# Patient Record
Sex: Female | Born: 1985 | Race: Black or African American | Hispanic: No | Marital: Single | Smoking: Former smoker
Health system: Southern US, Community
[De-identification: ages and names within clinical notes are randomized; demographics above are authoritative.]

## PROBLEM LIST (undated history)

## (undated) ENCOUNTER — Inpatient Hospital Stay (HOSPITAL_COMMUNITY): Payer: Self-pay

## (undated) DIAGNOSIS — D649 Anemia, unspecified: Secondary | ICD-10-CM

## (undated) HISTORY — PX: NO PAST SURGERIES: SHX2092

---
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
## 2004-02-09 ENCOUNTER — Ambulatory Visit (HOSPITAL_COMMUNITY): Admission: RE | Admit: 2004-02-09 | Discharge: 2004-02-09 | Payer: Self-pay | Admitting: *Deleted

## 2004-02-13 ENCOUNTER — Emergency Department (HOSPITAL_COMMUNITY): Admission: EM | Admit: 2004-02-13 | Discharge: 2004-02-13 | Payer: Self-pay | Admitting: Emergency Medicine

## 2004-03-13 ENCOUNTER — Inpatient Hospital Stay (HOSPITAL_COMMUNITY): Admission: AD | Admit: 2004-03-13 | Discharge: 2004-03-13 | Payer: Self-pay | Admitting: *Deleted

## 2004-04-12 ENCOUNTER — Ambulatory Visit (HOSPITAL_COMMUNITY): Admission: RE | Admit: 2004-04-12 | Discharge: 2004-04-12 | Payer: Self-pay | Admitting: *Deleted

## 2004-06-02 ENCOUNTER — Ambulatory Visit: Payer: Self-pay | Admitting: Family Medicine

## 2004-06-02 ENCOUNTER — Inpatient Hospital Stay (HOSPITAL_COMMUNITY): Admission: AD | Admit: 2004-06-02 | Discharge: 2004-06-02 | Payer: Self-pay | Admitting: *Deleted

## 2004-06-11 ENCOUNTER — Ambulatory Visit (HOSPITAL_COMMUNITY): Admission: RE | Admit: 2004-06-11 | Discharge: 2004-06-11 | Payer: Self-pay | Admitting: *Deleted

## 2004-07-17 ENCOUNTER — Inpatient Hospital Stay (HOSPITAL_COMMUNITY): Admission: AD | Admit: 2004-07-17 | Discharge: 2004-07-17 | Payer: Self-pay | Admitting: *Deleted

## 2004-07-22 ENCOUNTER — Observation Stay (HOSPITAL_COMMUNITY): Admission: AD | Admit: 2004-07-22 | Discharge: 2004-07-23 | Payer: Self-pay | Admitting: *Deleted

## 2004-07-22 ENCOUNTER — Ambulatory Visit: Payer: Self-pay | Admitting: Obstetrics & Gynecology

## 2004-08-08 ENCOUNTER — Inpatient Hospital Stay (HOSPITAL_COMMUNITY): Admission: AD | Admit: 2004-08-08 | Discharge: 2004-08-11 | Payer: Self-pay | Admitting: Obstetrics and Gynecology

## 2004-08-08 ENCOUNTER — Ambulatory Visit: Payer: Self-pay | Admitting: Obstetrics & Gynecology

## 2004-12-12 ENCOUNTER — Ambulatory Visit: Payer: Self-pay | Admitting: Family Medicine

## 2005-06-24 ENCOUNTER — Inpatient Hospital Stay (HOSPITAL_COMMUNITY): Admission: AD | Admit: 2005-06-24 | Discharge: 2005-06-24 | Payer: Self-pay | Admitting: *Deleted

## 2005-07-23 ENCOUNTER — Ambulatory Visit (HOSPITAL_COMMUNITY): Admission: RE | Admit: 2005-07-23 | Discharge: 2005-07-23 | Payer: Self-pay | Admitting: *Deleted

## 2005-08-27 ENCOUNTER — Ambulatory Visit (HOSPITAL_COMMUNITY): Admission: RE | Admit: 2005-08-27 | Discharge: 2005-08-27 | Payer: Self-pay | Admitting: *Deleted

## 2005-10-12 ENCOUNTER — Inpatient Hospital Stay (HOSPITAL_COMMUNITY): Admission: AD | Admit: 2005-10-12 | Discharge: 2005-10-12 | Payer: Self-pay | Admitting: *Deleted

## 2005-10-12 ENCOUNTER — Ambulatory Visit: Payer: Self-pay | Admitting: Family Medicine

## 2005-11-12 ENCOUNTER — Inpatient Hospital Stay (HOSPITAL_COMMUNITY): Admission: AD | Admit: 2005-11-12 | Discharge: 2005-11-13 | Payer: Self-pay | Admitting: Obstetrics and Gynecology

## 2005-11-12 ENCOUNTER — Ambulatory Visit: Payer: Self-pay | Admitting: Family Medicine

## 2005-12-02 ENCOUNTER — Inpatient Hospital Stay (HOSPITAL_COMMUNITY): Admission: AD | Admit: 2005-12-02 | Discharge: 2005-12-02 | Payer: Self-pay | Admitting: *Deleted

## 2005-12-02 ENCOUNTER — Ambulatory Visit: Payer: Self-pay | Admitting: Obstetrics and Gynecology

## 2005-12-04 ENCOUNTER — Inpatient Hospital Stay (HOSPITAL_COMMUNITY): Admission: AD | Admit: 2005-12-04 | Discharge: 2005-12-07 | Payer: Self-pay | Admitting: Gynecology

## 2006-02-04 IMAGING — US US OB LIMITED
1 series · 18 of 28 positions shown · non-contrast
Comparison: none

CLINICAL DATA: Non-reactive non-stress test.

[Series 1: us fetal bpp w/o nonstress · non-contrast · 18 of 33 slices shown]
[im 1/33]
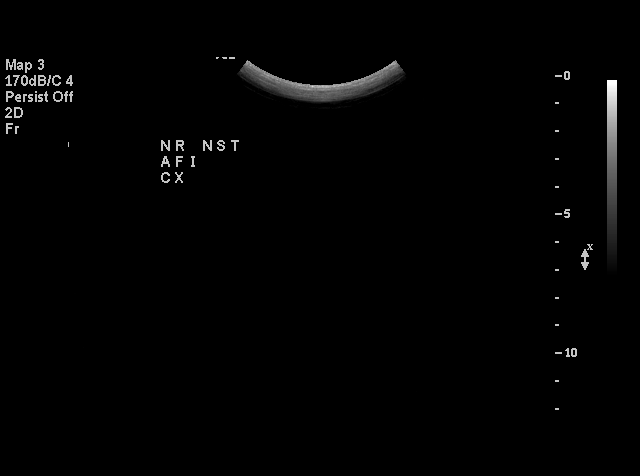
[im 3/33]
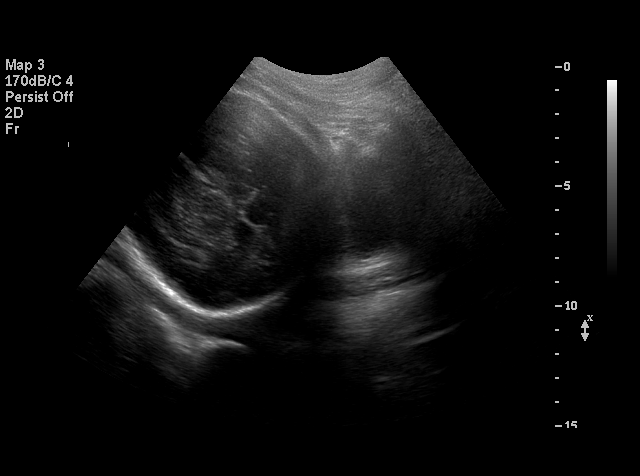
[im 4/33]
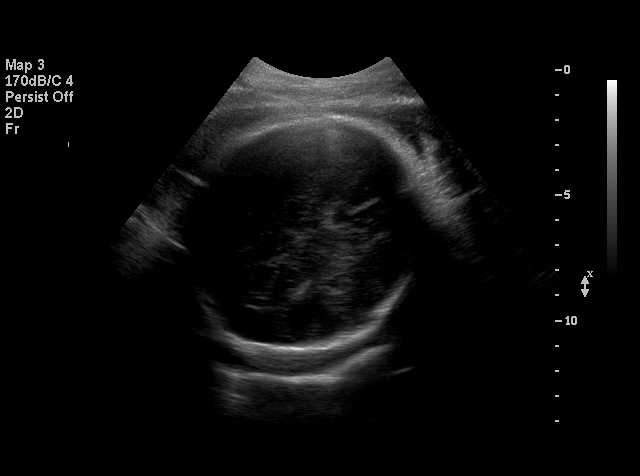
[im 6/33]
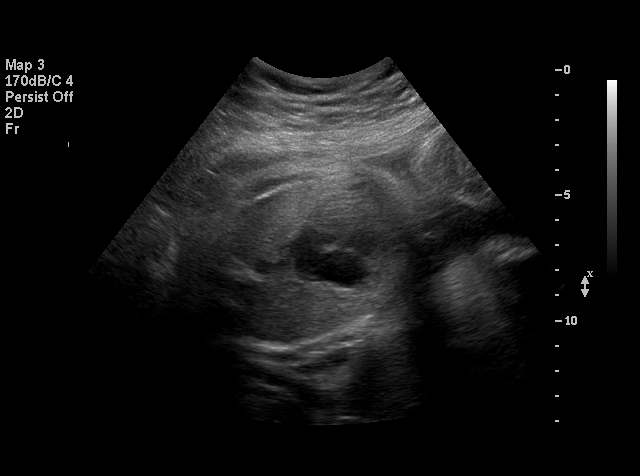
[im 9/33]
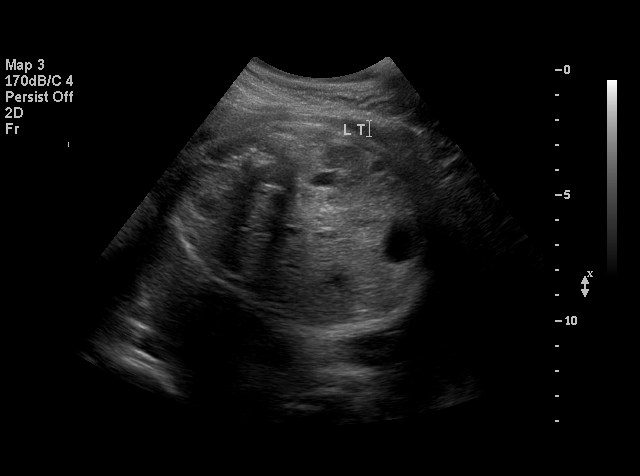
[im 10/33]
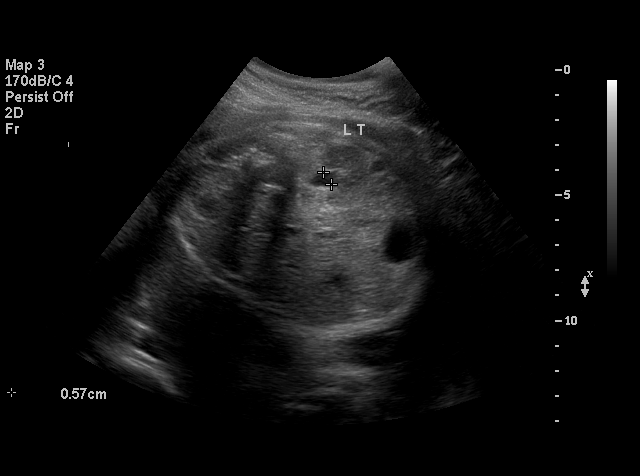
[im 12/33]
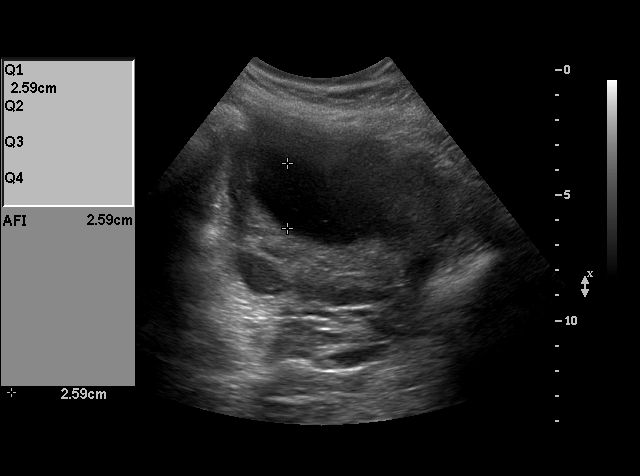
[im 14/33]
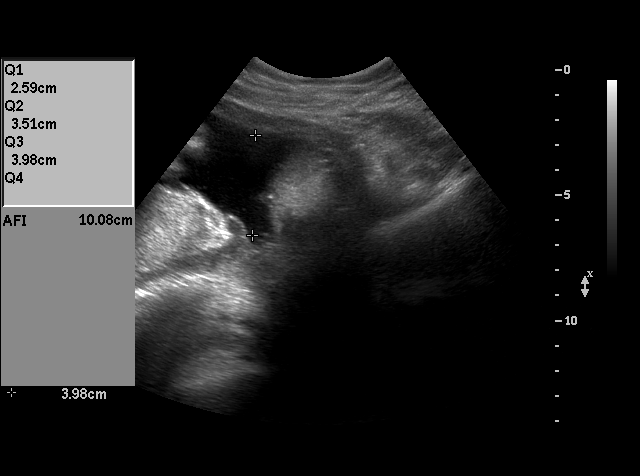
[im 16/33]
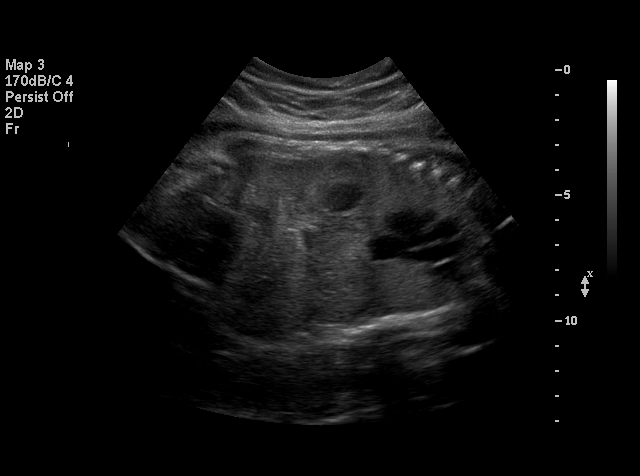
[im 17/33]
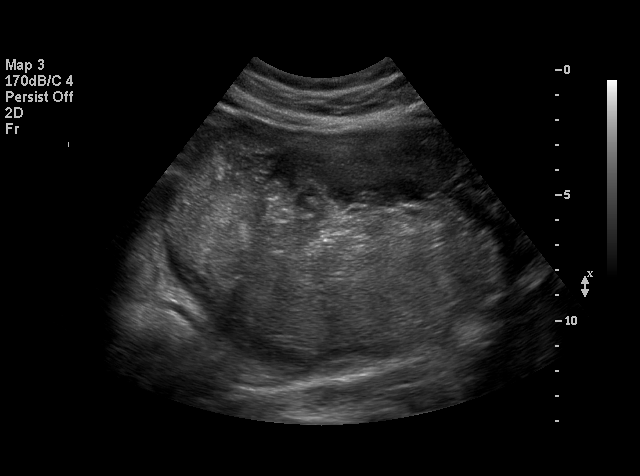
[im 19/33]
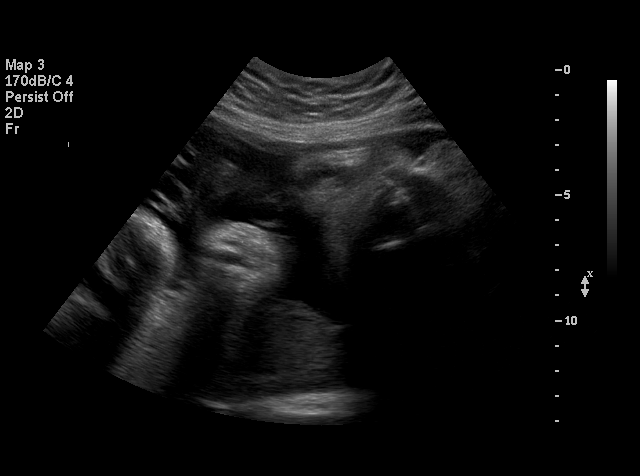
[im 21/33]
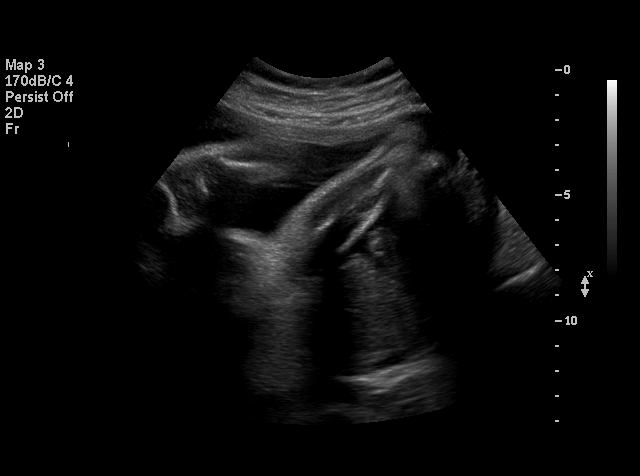
[im 23/33]
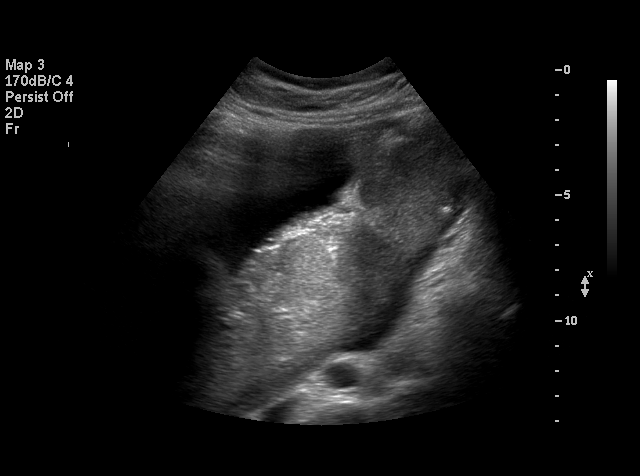
[im 25/33]
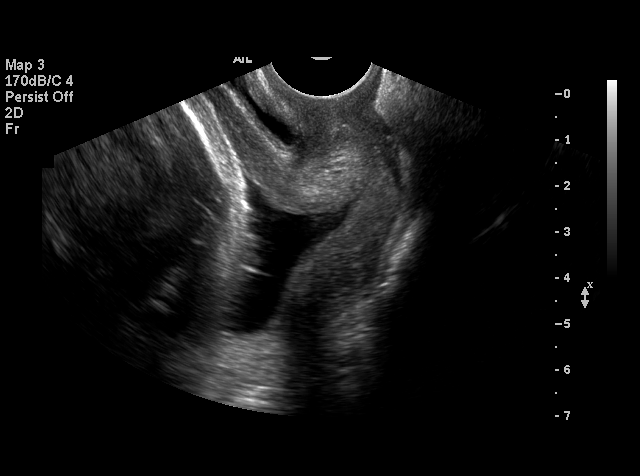
[im 27/33]
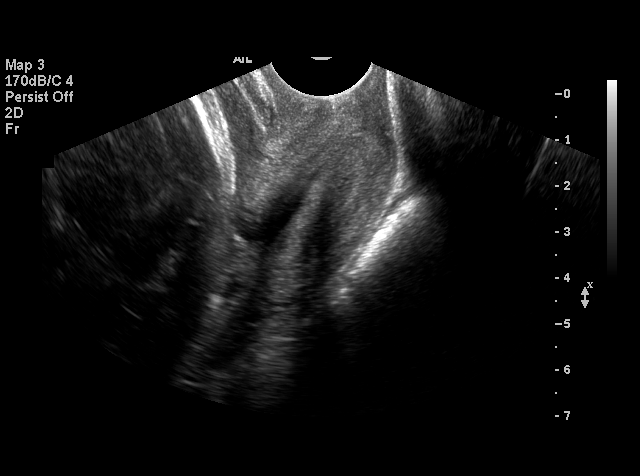
[im 29/33]
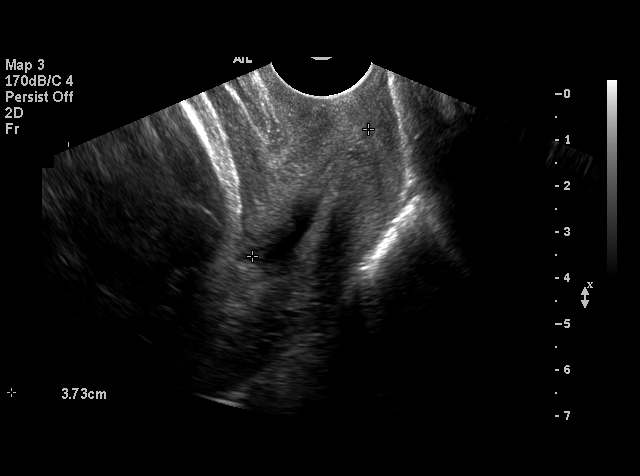
[im 30/33]
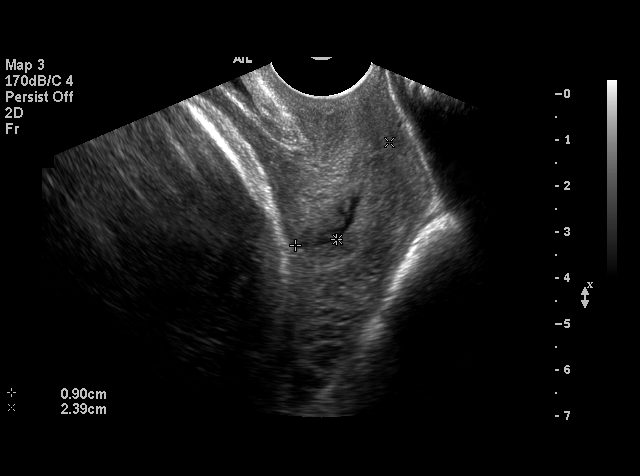
[im 33/33]
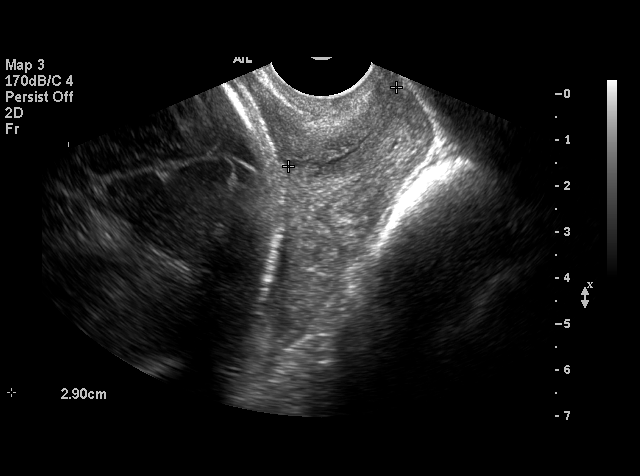

[18 of 28 positions shown; findings below may reference images not displayed]

LIMITED OBSTETRICAL ULTRASOUND WITH TRANSVAGINAL:
 Number of Fetuses: 1
 Heart Rate:  140
 Movement:  Yes
 Breathing:  Yes
 Presentation:  Cephalic
 Placental Location:  Fundal, posterior
 Grade:  II
 Previa:  No
 Amniotic Fluid (Subjective):  Normal
 Amniotic Fluid (Objective):  12.9 cm AFI (5th -95th%ile = 7.9 ? 24.9 cm for 35 wks)

 Fetal measurements and complete anatomic evaluation were not requested.  The following fetal anatomy was visualized during this exam:  Lateral ventricles, four chamber heart, stomach, kidneys, and bladder.  

 MATERNAL FINDINGS
 Cervix:  1.1 to 3.3 cm Transvaginally

 BIOPHYSICAL PROFILE

 Movement:  2    Time:  10 minutes
 Breathing:  2
 Tone:  2
 Amniotic Fluid:  2

 Total Score:  8
IMPRESSION: Living 35 week fetus in a cephalic presentation with a fundal posterior grade II placenta and normal amniotic fluid volume.  
 Biophysical profile score [DATE] in 10 minutes. 
 Dynamic cervix measuring between 1.1 and 3.3 cm.

## 2006-06-24 ENCOUNTER — Inpatient Hospital Stay (HOSPITAL_COMMUNITY): Admission: AD | Admit: 2006-06-24 | Discharge: 2006-06-24 | Payer: Self-pay | Admitting: Obstetrics and Gynecology

## 2006-10-23 ENCOUNTER — Ambulatory Visit (HOSPITAL_COMMUNITY): Admission: RE | Admit: 2006-10-23 | Discharge: 2006-10-23 | Payer: Self-pay | Admitting: Obstetrics & Gynecology

## 2006-12-12 ENCOUNTER — Ambulatory Visit: Payer: Self-pay | Admitting: Family Medicine

## 2006-12-12 ENCOUNTER — Other Ambulatory Visit: Admission: RE | Admit: 2006-12-12 | Discharge: 2006-12-12 | Payer: Self-pay | Admitting: Family Medicine

## 2006-12-12 ENCOUNTER — Encounter (INDEPENDENT_AMBULATORY_CARE_PROVIDER_SITE_OTHER): Payer: Self-pay | Admitting: Specialist

## 2007-01-01 ENCOUNTER — Inpatient Hospital Stay (HOSPITAL_COMMUNITY): Admission: AD | Admit: 2007-01-01 | Discharge: 2007-01-01 | Payer: Self-pay | Admitting: Obstetrics & Gynecology

## 2007-01-01 ENCOUNTER — Ambulatory Visit: Payer: Self-pay | Admitting: Physician Assistant

## 2007-02-10 ENCOUNTER — Inpatient Hospital Stay (HOSPITAL_COMMUNITY): Admission: AD | Admit: 2007-02-10 | Discharge: 2007-02-10 | Payer: Self-pay | Admitting: Family Medicine

## 2007-02-10 ENCOUNTER — Ambulatory Visit: Payer: Self-pay | Admitting: *Deleted

## 2007-02-10 ENCOUNTER — Ambulatory Visit: Payer: Self-pay | Admitting: Obstetrics and Gynecology

## 2007-02-15 ENCOUNTER — Ambulatory Visit: Payer: Self-pay | Admitting: *Deleted

## 2007-02-15 ENCOUNTER — Inpatient Hospital Stay (HOSPITAL_COMMUNITY): Admission: AD | Admit: 2007-02-15 | Discharge: 2007-02-15 | Payer: Self-pay | Admitting: Obstetrics & Gynecology

## 2007-02-17 ENCOUNTER — Inpatient Hospital Stay (HOSPITAL_COMMUNITY): Admission: AD | Admit: 2007-02-17 | Discharge: 2007-02-17 | Payer: Self-pay | Admitting: Family Medicine

## 2007-02-20 ENCOUNTER — Inpatient Hospital Stay (HOSPITAL_COMMUNITY): Admission: AD | Admit: 2007-02-20 | Discharge: 2007-02-22 | Payer: Self-pay | Admitting: Obstetrics & Gynecology

## 2007-02-20 ENCOUNTER — Ambulatory Visit: Payer: Self-pay | Admitting: Obstetrics & Gynecology

## 2008-10-20 ENCOUNTER — Emergency Department (HOSPITAL_COMMUNITY): Admission: EM | Admit: 2008-10-20 | Discharge: 2008-10-20 | Payer: Self-pay | Admitting: Emergency Medicine

## 2008-12-28 ENCOUNTER — Emergency Department (HOSPITAL_COMMUNITY): Admission: EM | Admit: 2008-12-28 | Discharge: 2008-12-28 | Payer: Self-pay | Admitting: Family Medicine

## 2009-02-19 ENCOUNTER — Emergency Department (HOSPITAL_COMMUNITY): Admission: EM | Admit: 2009-02-19 | Discharge: 2009-02-19 | Payer: Self-pay | Admitting: Emergency Medicine

## 2009-03-22 ENCOUNTER — Inpatient Hospital Stay (HOSPITAL_COMMUNITY): Admission: AD | Admit: 2009-03-22 | Discharge: 2009-03-22 | Payer: Self-pay | Admitting: Obstetrics and Gynecology

## 2009-03-24 ENCOUNTER — Inpatient Hospital Stay (HOSPITAL_COMMUNITY): Admission: AD | Admit: 2009-03-24 | Discharge: 2009-03-24 | Payer: Self-pay | Admitting: Obstetrics & Gynecology

## 2009-06-15 ENCOUNTER — Ambulatory Visit (HOSPITAL_COMMUNITY): Admission: RE | Admit: 2009-06-15 | Discharge: 2009-06-15 | Payer: Self-pay | Admitting: Obstetrics

## 2009-09-09 ENCOUNTER — Ambulatory Visit (HOSPITAL_COMMUNITY): Admission: AD | Admit: 2009-09-09 | Discharge: 2009-09-09 | Payer: Self-pay | Admitting: Obstetrics

## 2009-09-15 ENCOUNTER — Ambulatory Visit (HOSPITAL_COMMUNITY): Admission: RE | Admit: 2009-09-15 | Discharge: 2009-09-15 | Payer: Self-pay | Admitting: Obstetrics & Gynecology

## 2009-11-06 ENCOUNTER — Inpatient Hospital Stay (HOSPITAL_COMMUNITY): Admission: AD | Admit: 2009-11-06 | Discharge: 2009-11-06 | Payer: Self-pay | Admitting: Obstetrics

## 2009-11-10 ENCOUNTER — Inpatient Hospital Stay (HOSPITAL_COMMUNITY): Admission: AD | Admit: 2009-11-10 | Discharge: 2009-11-10 | Payer: Self-pay | Admitting: Obstetrics & Gynecology

## 2009-11-12 ENCOUNTER — Inpatient Hospital Stay (HOSPITAL_COMMUNITY): Admission: AD | Admit: 2009-11-12 | Discharge: 2009-11-15 | Payer: Self-pay | Admitting: Obstetrics & Gynecology

## 2009-11-12 ENCOUNTER — Ambulatory Visit: Payer: Self-pay | Admitting: Obstetrics and Gynecology

## 2009-11-12 ENCOUNTER — Inpatient Hospital Stay (HOSPITAL_COMMUNITY): Admission: AD | Admit: 2009-11-12 | Discharge: 2009-11-12 | Payer: Self-pay | Admitting: Obstetrics & Gynecology

## 2009-11-12 ENCOUNTER — Encounter: Payer: Self-pay | Admitting: Obstetrics & Gynecology

## 2010-06-04 ENCOUNTER — Emergency Department (HOSPITAL_COMMUNITY): Admission: EM | Admit: 2010-06-04 | Discharge: 2010-06-04 | Payer: Self-pay | Admitting: Emergency Medicine

## 2010-08-02 ENCOUNTER — Ambulatory Visit: Payer: Self-pay | Admitting: Nurse Practitioner

## 2010-08-02 DIAGNOSIS — M79609 Pain in unspecified limb: Secondary | ICD-10-CM

## 2010-08-02 DIAGNOSIS — I10 Essential (primary) hypertension: Secondary | ICD-10-CM

## 2010-08-02 DIAGNOSIS — I8390 Asymptomatic varicose veins of unspecified lower extremity: Secondary | ICD-10-CM

## 2010-08-02 DIAGNOSIS — E669 Obesity, unspecified: Secondary | ICD-10-CM | POA: Insufficient documentation

## 2010-09-22 ENCOUNTER — Encounter: Payer: Self-pay | Admitting: *Deleted

## 2010-10-02 NOTE — Letter (Signed)
Summary: Work Excuse  [facility]  [address] [place], [place] [address]   Phone: [phone]  Fax: [phone]    Today's Date: August 02, 2010  Name of Patient: Rhonda Howard  The above named patient had a medical visit today   Please take this into consideration when reviewing the time away from work  [name] has varicose veins in her lower extremities.  Legs have most likely been swelling from extended time on her feet over the past couple of weeks.  She has been ordered to get TED/Compression stockings to use while standing. She should also avoid salt in her diet. Comfortable shoes while at work is also beneficial.   Sincerely yours,   Lehman Prom FNP [facility]

## 2010-10-02 NOTE — Letter (Signed)
Summary: PT INFORMATION SHEET  PT INFORMATION SHEET   Imported By: Arta Bruce 08/02/2010 16:29:19  _____________________________________________________________________  External Attachment:    Type:   Image     Comment:   External Document
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
## 2010-10-02 NOTE — Letter (Signed)
Summary: Handout Printed  Printed Handout:  - Varicose Veins 

## 2010-10-10 IMAGING — US US OB TRANSVAGINAL MODIFY
1 series · 14 of 28 positions shown · non-contrast
Comparison: none

OBSTETRICAL ULTRASOUND:
 This ultrasound exam was performed in the [HOSPITAL] Ultrasound Department.  The OB US report was generated in the AS system, and faxed to the ordering physician.  This report is also available in [REDACTED] PACS.

[Series 1: us ob comp less 14 wks · 0.20mm/px · 56 acquisitions, 14 frames shown]
[im 3/56]
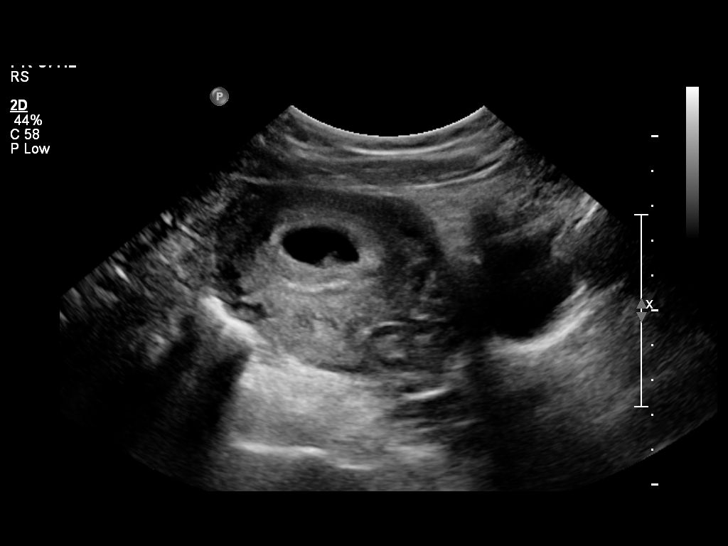
[im 7/56]
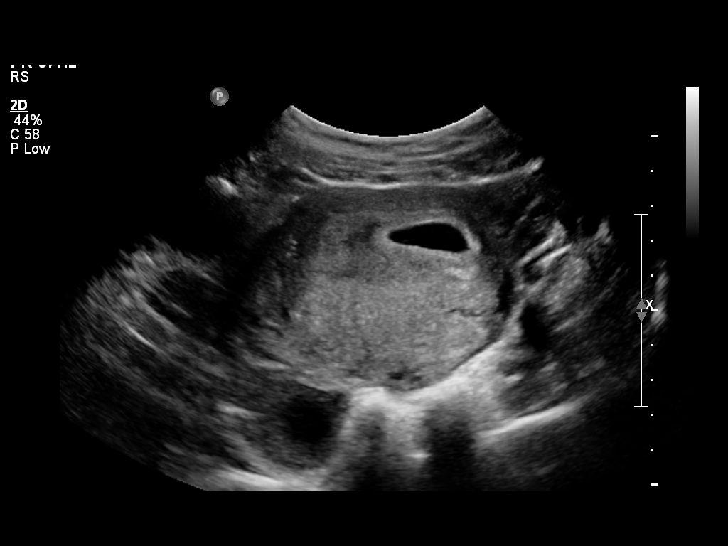
[im 11/56]
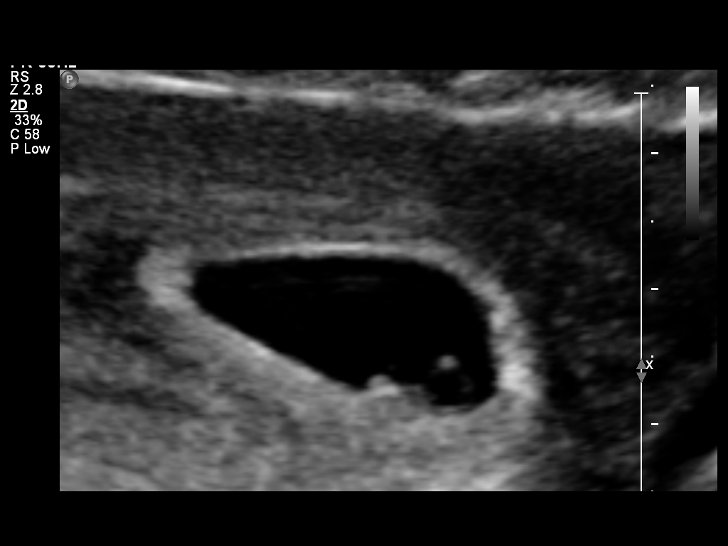
[im 15/56]
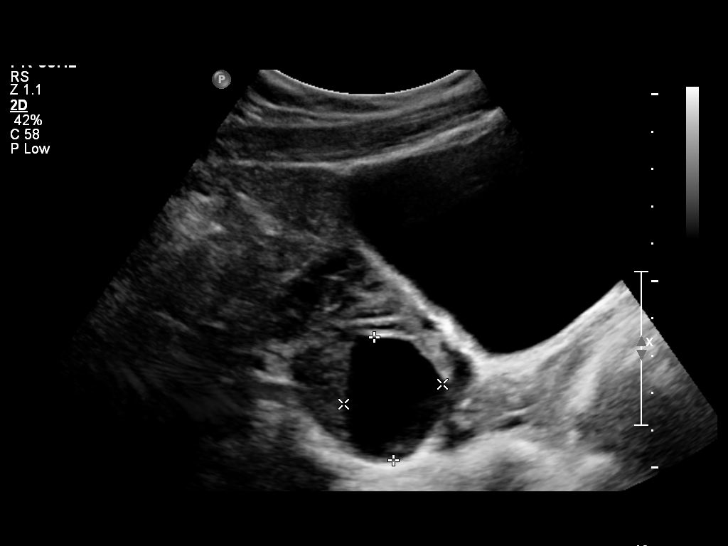
[im 19/56]
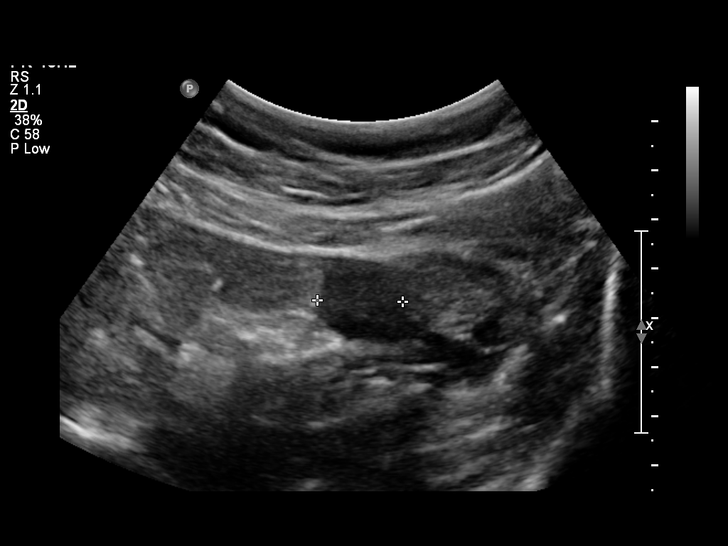
[im 23/56]
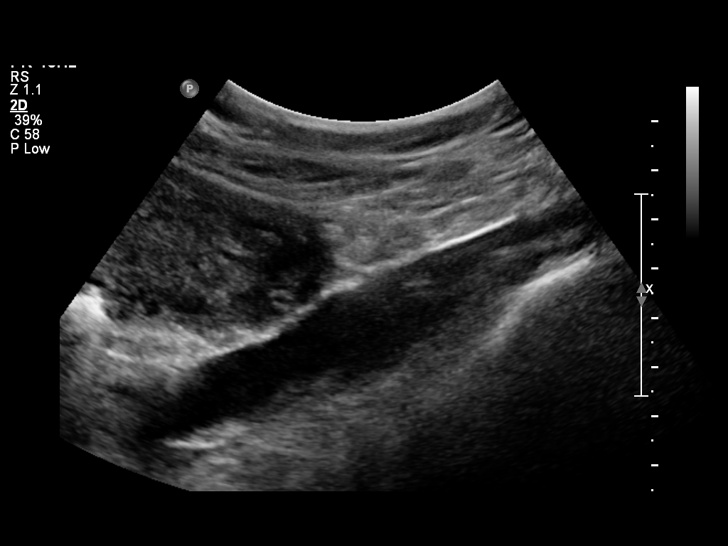
[im 27/56]
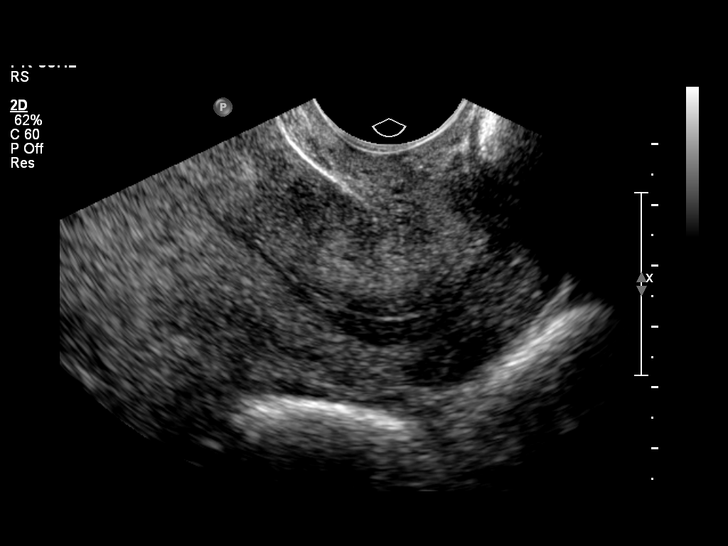
[im 31/56]
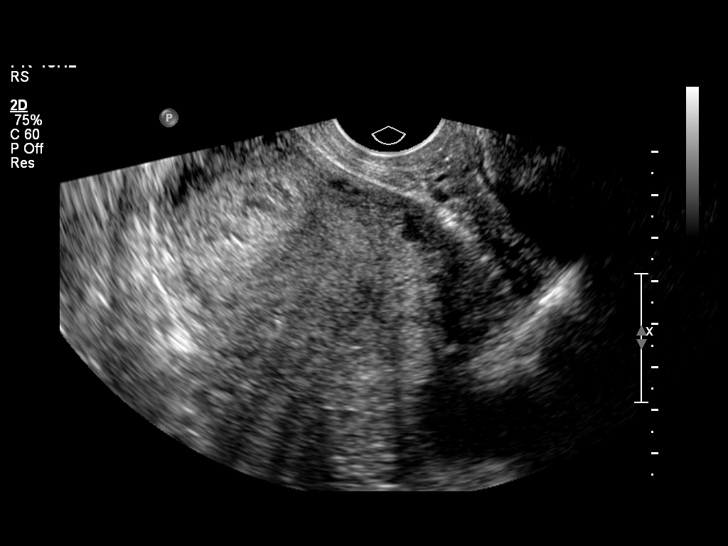
[im 35/56]
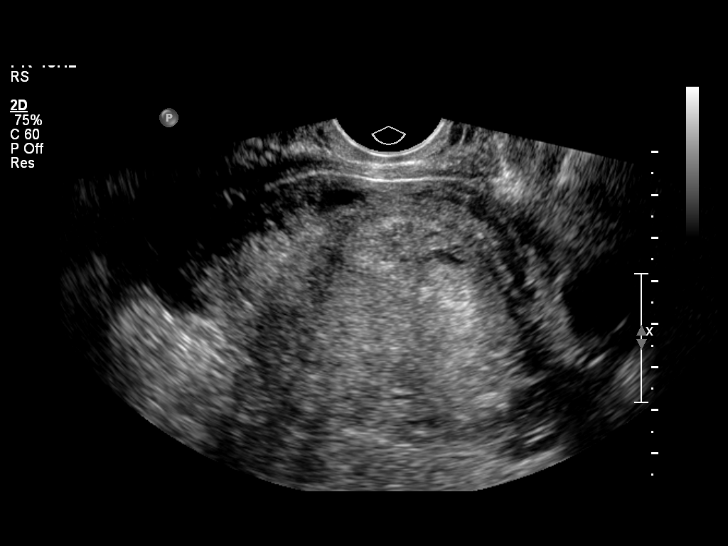
[im 39/56]
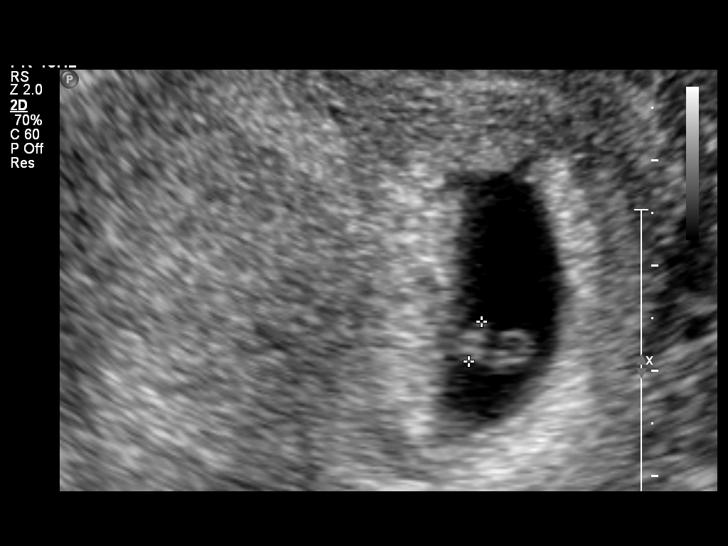
[im 43/56]
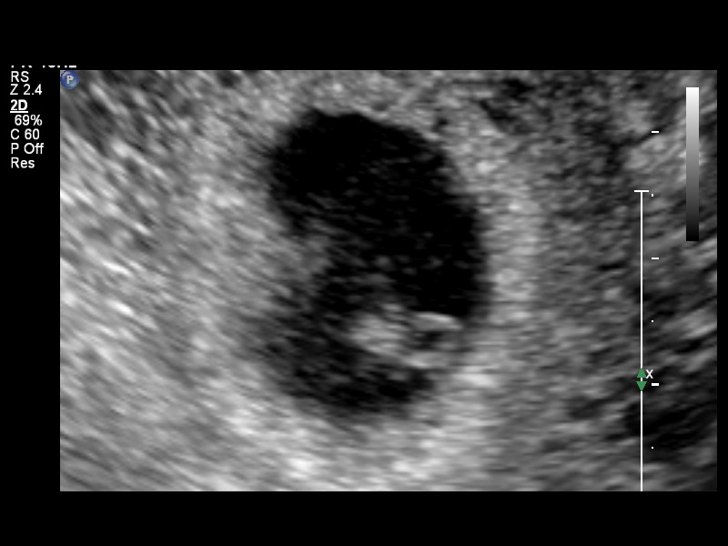
[im 47/56]
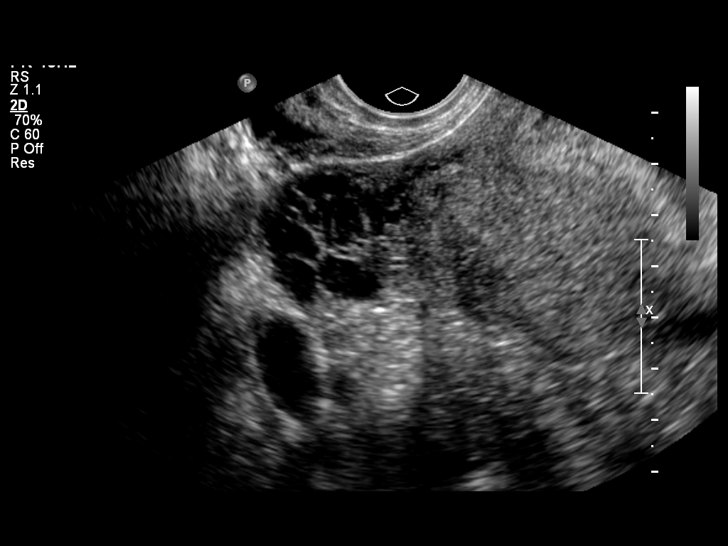
[im 51/56]
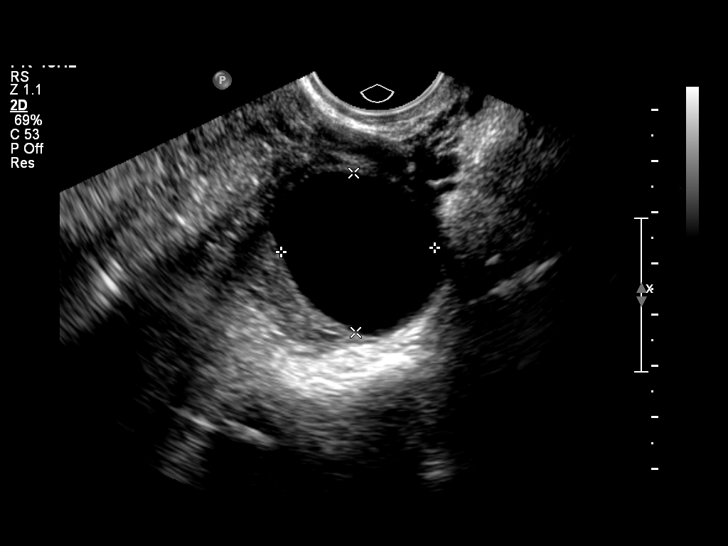
[im 56/56]
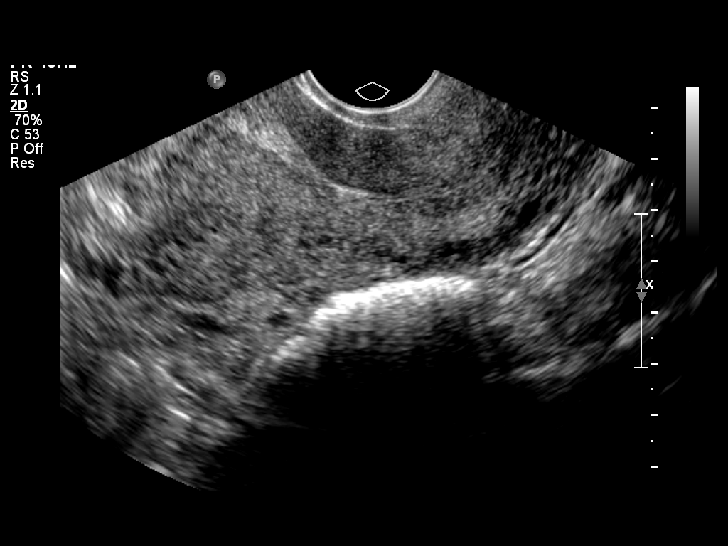

[14 of 28 positions shown; findings below may reference images not displayed]

IMPRESSION: See AS Obstetric US report.

## 2010-11-18 LAB — RH IMMUNE GLOBULIN WORKUP (NOT WOMEN'S HOSP)
ABO/RH(D): O NEG
Antibody Screen: NEGATIVE

## 2010-11-25 LAB — CBC
MCHC: 33.6 g/dL (ref 30.0–36.0)
MCHC: 33.7 g/dL (ref 30.0–36.0)
MCV: 98.1 fL (ref 78.0–100.0)
Platelets: 225 10*3/uL (ref 150–400)
Platelets: 226 10*3/uL (ref 150–400)
RDW: 12.7 % (ref 11.5–15.5)
RDW: 13.2 % (ref 11.5–15.5)

## 2010-11-25 LAB — RPR: RPR Ser Ql: NONREACTIVE

## 2010-11-25 LAB — RH IMMUNE GLOB WKUP(>/=20WKS)(NOT WOMEN'S HOSP)
Antibody Screen: POSITIVE
DAT, IgG: NEGATIVE
Fetal Screen: NEGATIVE

## 2010-12-09 LAB — URINALYSIS, ROUTINE W REFLEX MICROSCOPIC
Bilirubin Urine: NEGATIVE
Glucose, UA: NEGATIVE mg/dL
Glucose, UA: NEGATIVE mg/dL
Hgb urine dipstick: NEGATIVE
Hgb urine dipstick: NEGATIVE
Ketones, ur: NEGATIVE mg/dL
Ketones, ur: NEGATIVE mg/dL
Protein, ur: NEGATIVE mg/dL
Protein, ur: NEGATIVE mg/dL
Urobilinogen, UA: 1 mg/dL (ref 0.0–1.0)
pH: 6 (ref 5.0–8.0)

## 2010-12-09 LAB — WET PREP, GENITAL
Clue Cells Wet Prep HPF POC: NONE SEEN
Trich, Wet Prep: NONE SEEN
Yeast Wet Prep HPF POC: NONE SEEN

## 2010-12-09 LAB — GC/CHLAMYDIA PROBE AMP, GENITAL
Chlamydia, DNA Probe: NEGATIVE
GC Probe Amp, Genital: NEGATIVE

## 2010-12-09 LAB — POCT PREGNANCY, URINE
Preg Test, Ur: POSITIVE
Preg Test, Ur: POSITIVE

## 2010-12-09 LAB — CBC
HCT: 33.8 % — ABNORMAL LOW (ref 36.0–46.0)
Hemoglobin: 12 g/dL (ref 12.0–15.0)
RBC: 3.6 MIL/uL — ABNORMAL LOW (ref 3.87–5.11)

## 2010-12-09 LAB — ABO/RH: ABO/RH(D): O NEG

## 2011-01-03 IMAGING — US US OB DETAIL+14 WK
2 series · 14 of 28 positions shown · non-contrast
Comparison: none

OBSTETRICAL ULTRASOUND:
 This ultrasound exam was performed in the [HOSPITAL] Ultrasound Department.  The OB US report was generated in the AS system, and faxed to the ordering physician.  This report is also available in [HOSPITAL]?s [employer] and in [REDACTED] PACS.

[Series 1: us ob detail +14 wk · 0.16mm/px · 13 of 88 slices shown (1 of 2)]
[im 4/88]
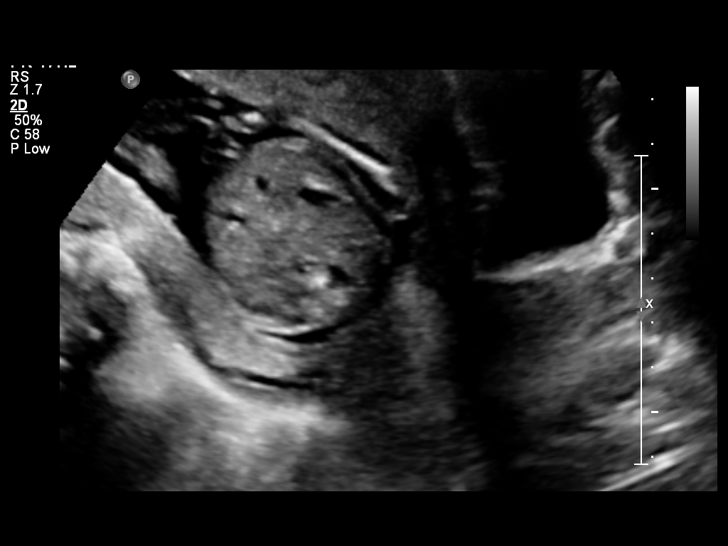
[im 11/88]
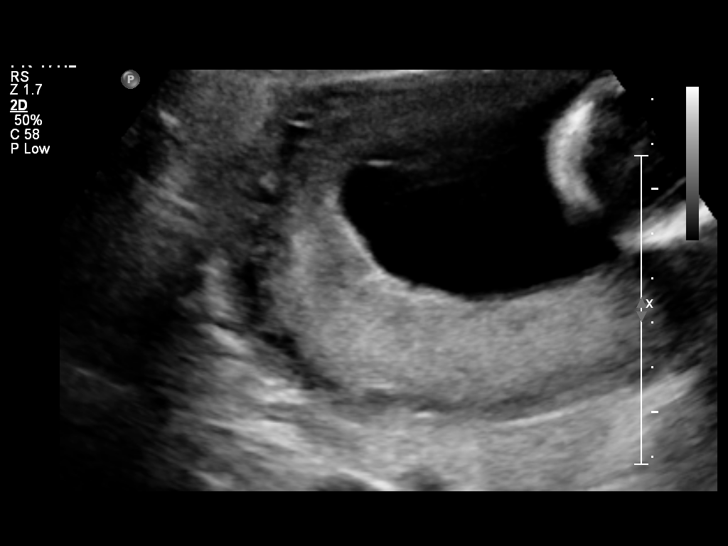
[im 17/88]
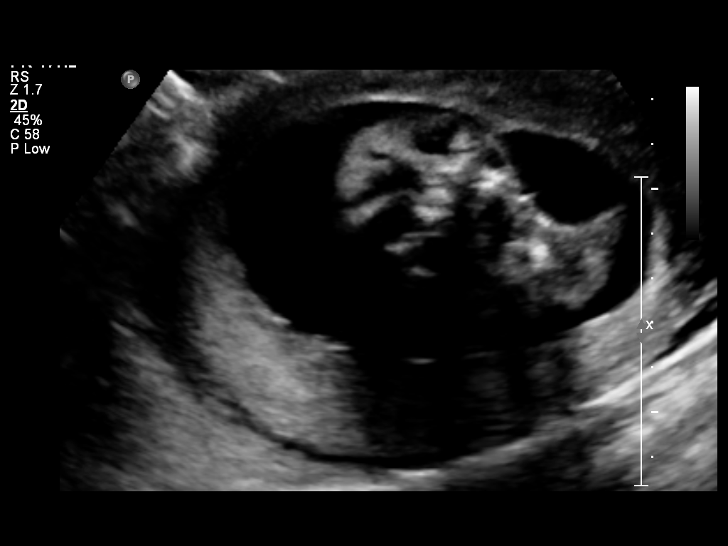
[im 24/88]
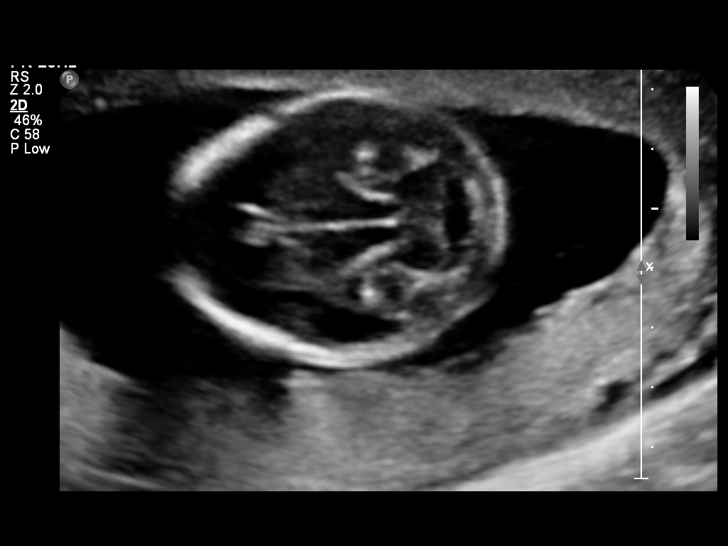
[im 31/88]
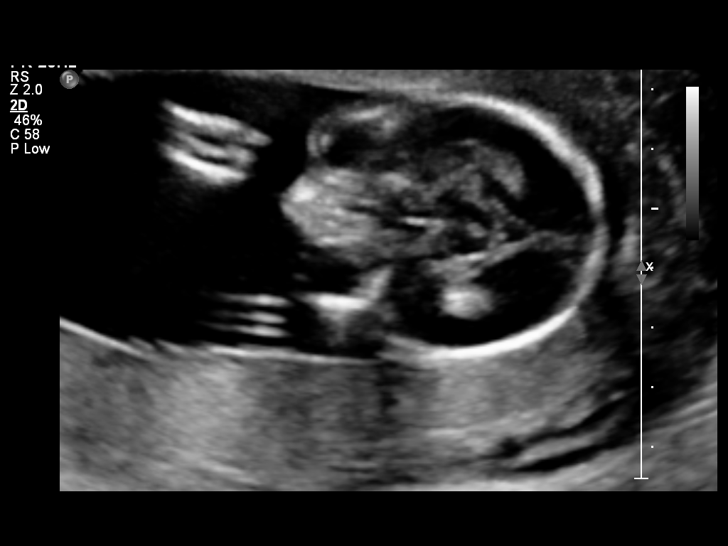
[im 37/88]
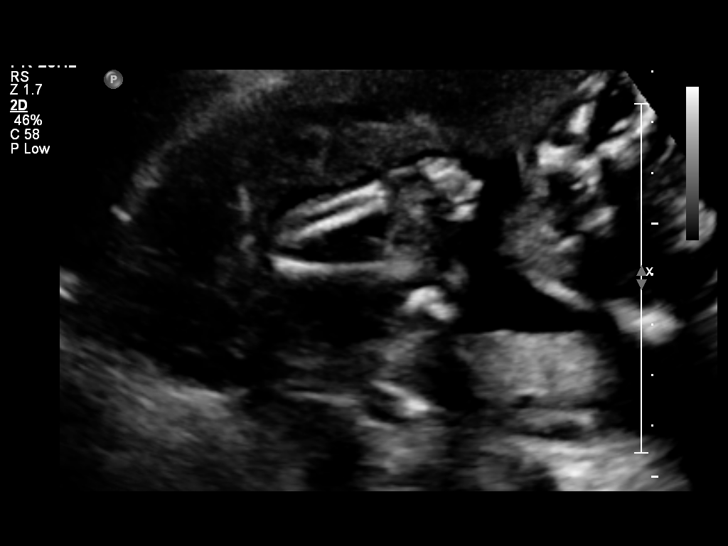
[im 44/88]
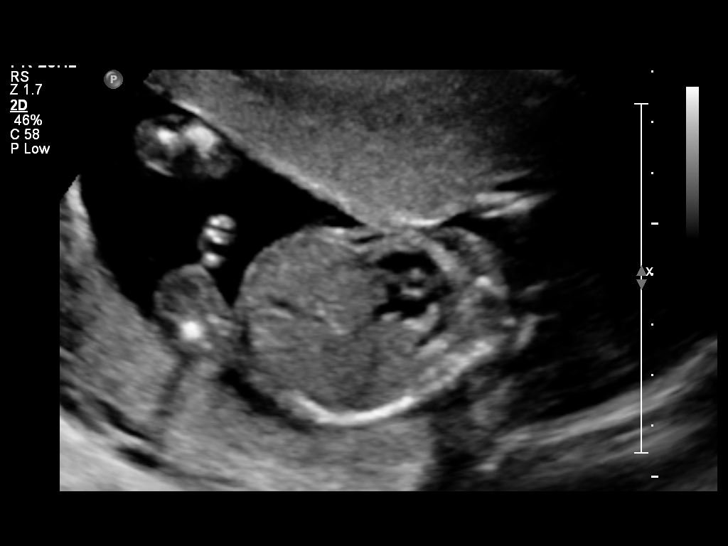
[im 51/88]
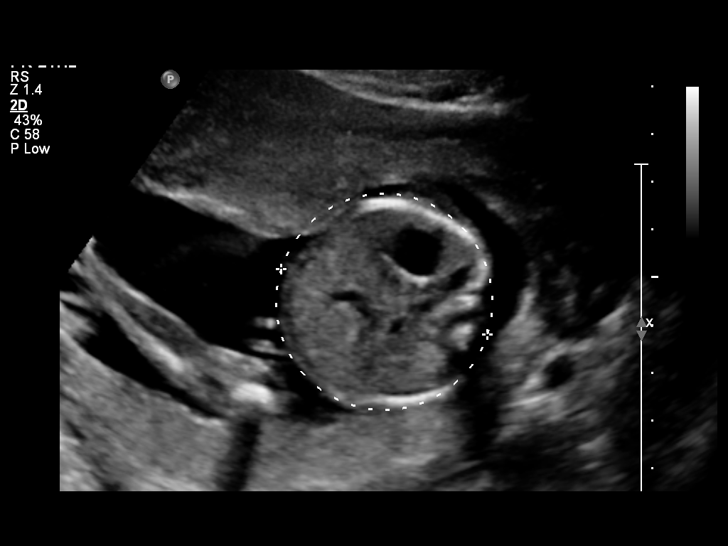
[im 57/88]
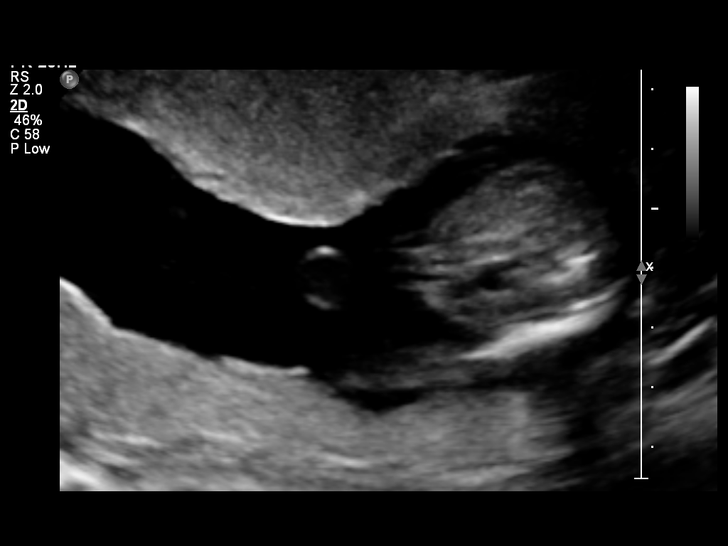
[im 64/88]
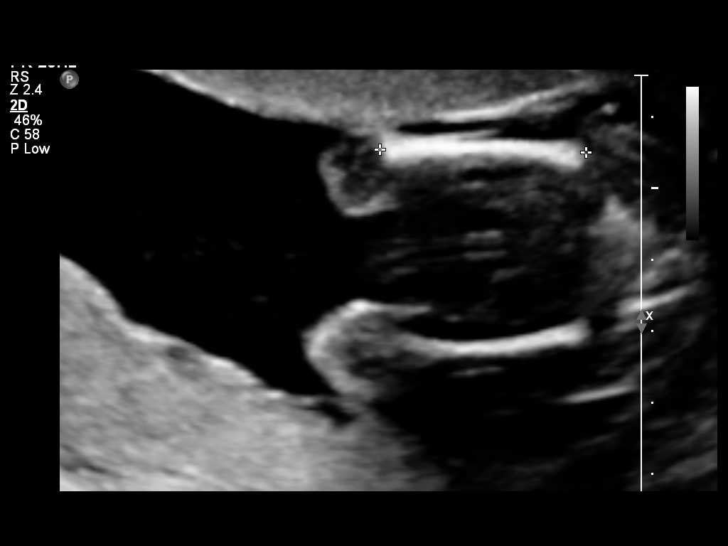
[im 71/88]
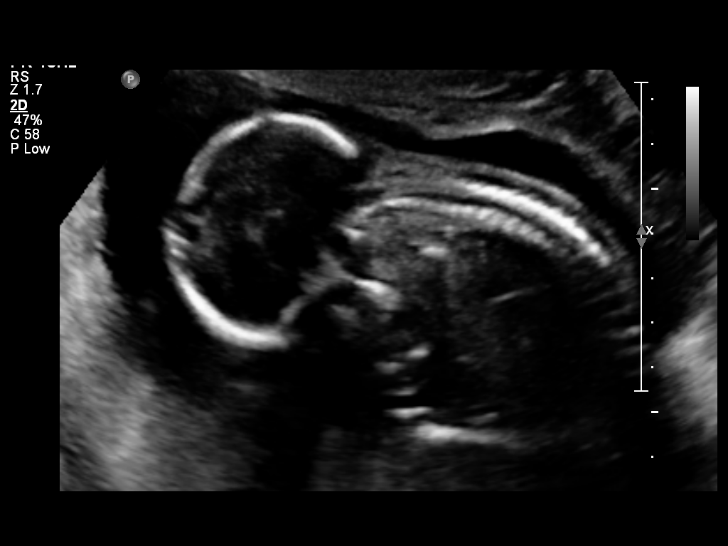
[im 77/88]
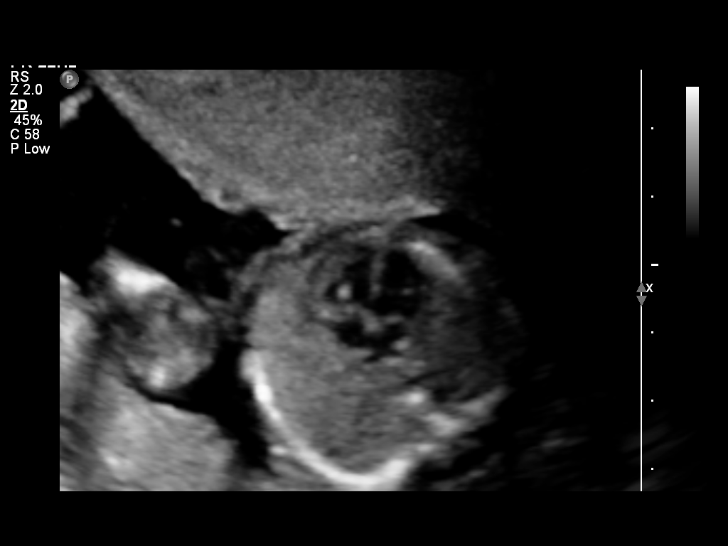
[im 84/88]
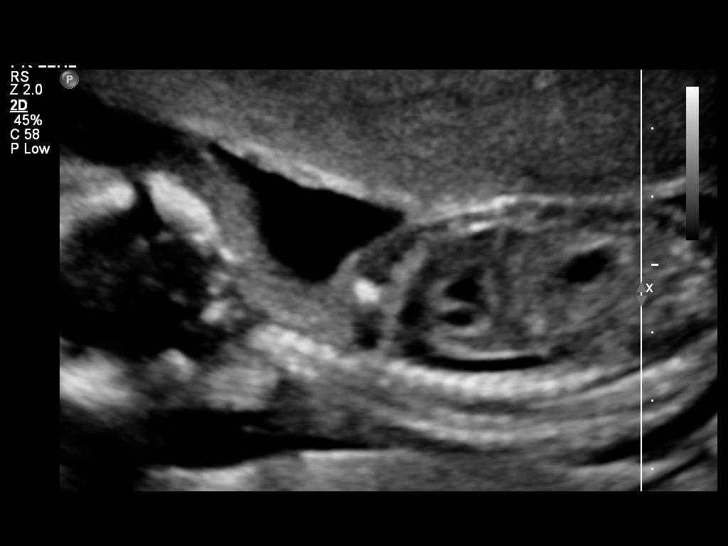

[Series 1: us ob detail +14 wk · 0.15mm/px · 1 of 4 slices shown (2 of 2)]
[im 1/4]
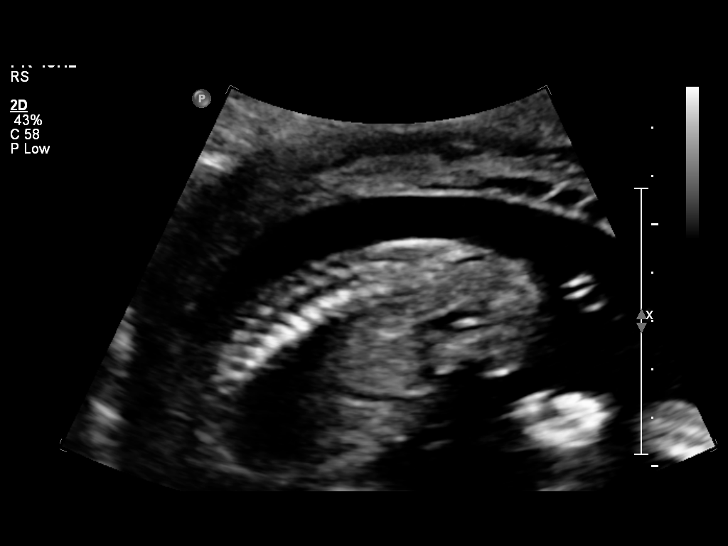

[14 of 28 positions shown; findings below may reference images not displayed]

IMPRESSION: See AS Obstetric US report.
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
## 2011-04-11 ENCOUNTER — Emergency Department (HOSPITAL_COMMUNITY)
Admission: EM | Admit: 2011-04-11 | Discharge: 2011-04-11 | Discharge status: Against Medical Advice | Payer: Medicaid Other | Attending: Emergency Medicine | Admitting: Emergency Medicine

## 2011-04-11 DIAGNOSIS — Z0389 Encounter for observation for other suspected diseases and conditions ruled out: Secondary | ICD-10-CM | POA: Insufficient documentation

## 2011-04-12 ENCOUNTER — Inpatient Hospital Stay (INDEPENDENT_AMBULATORY_CARE_PROVIDER_SITE_OTHER)
Admission: RE | Admit: 2011-04-12 | Discharge: 2011-04-12 | Disposition: A | Discharge status: Home / Self Care | Payer: Self-pay | Source: Ambulatory Visit | Attending: Family Medicine | Admitting: Family Medicine

## 2011-04-12 DIAGNOSIS — H109 Unspecified conjunctivitis: Secondary | ICD-10-CM

## 2011-06-19 LAB — RH IMMUNE GLOB WKUP(>/=20WKS)(NOT WOMEN'S HOSP): Fetal Screen: NEGATIVE

## 2011-06-19 LAB — CBC
Hemoglobin: 10.6 — ABNORMAL LOW
MCV: 93.8
Platelets: 249
RDW: 12.9
WBC: 4.9
WBC: 7.9

## 2011-06-19 LAB — RPR: RPR Ser Ql: NONREACTIVE

## 2011-06-19 LAB — WET PREP, GENITAL
Clue Cells Wet Prep HPF POC: NONE SEEN
Trich, Wet Prep: NONE SEEN
Yeast Wet Prep HPF POC: NONE SEEN

## 2011-06-20 LAB — WET PREP, GENITAL: Clue Cells Wet Prep HPF POC: NONE SEEN

## 2011-12-17 ENCOUNTER — Other Ambulatory Visit: Payer: Self-pay | Admitting: Family Medicine

## 2011-12-17 ENCOUNTER — Other Ambulatory Visit (HOSPITAL_COMMUNITY)
Admission: RE | Admit: 2011-12-17 | Discharge: 2011-12-17 | Disposition: A | Discharge status: Home / Self Care | Payer: Medicaid Other | Source: Ambulatory Visit | Attending: Internal Medicine | Admitting: Internal Medicine

## 2011-12-17 DIAGNOSIS — Z01419 Encounter for gynecological examination (general) (routine) without abnormal findings: Secondary | ICD-10-CM | POA: Insufficient documentation

## 2012-06-01 ENCOUNTER — Inpatient Hospital Stay (HOSPITAL_COMMUNITY): Payer: Medicaid Other

## 2012-06-01 ENCOUNTER — Inpatient Hospital Stay (HOSPITAL_COMMUNITY)
Admission: AD | Admit: 2012-06-01 | Discharge: 2012-06-01 | Disposition: A | Discharge status: Home / Self Care | Payer: Medicaid Other | Source: Ambulatory Visit | Attending: Obstetrics & Gynecology | Admitting: Obstetrics & Gynecology

## 2012-06-01 ENCOUNTER — Encounter (HOSPITAL_COMMUNITY): Payer: Self-pay | Admitting: *Deleted

## 2012-06-01 DIAGNOSIS — R109 Unspecified abdominal pain: Secondary | ICD-10-CM | POA: Insufficient documentation

## 2012-06-01 DIAGNOSIS — O9989 Other specified diseases and conditions complicating pregnancy, childbirth and the puerperium: Secondary | ICD-10-CM

## 2012-06-01 DIAGNOSIS — O26899 Other specified pregnancy related conditions, unspecified trimester: Secondary | ICD-10-CM

## 2012-06-01 DIAGNOSIS — Z3490 Encounter for supervision of normal pregnancy, unspecified, unspecified trimester: Secondary | ICD-10-CM

## 2012-06-01 DIAGNOSIS — O99891 Other specified diseases and conditions complicating pregnancy: Secondary | ICD-10-CM | POA: Insufficient documentation

## 2012-06-01 DIAGNOSIS — N949 Unspecified condition associated with female genital organs and menstrual cycle: Secondary | ICD-10-CM

## 2012-06-01 LAB — URINALYSIS, ROUTINE W REFLEX MICROSCOPIC
Hgb urine dipstick: NEGATIVE
Nitrite: NEGATIVE
Specific Gravity, Urine: 1.025 (ref 1.005–1.030)
Urobilinogen, UA: 1 mg/dL (ref 0.0–1.0)

## 2012-06-01 LAB — WET PREP, GENITAL
Trich, Wet Prep: NONE SEEN
Yeast Wet Prep HPF POC: NONE SEEN

## 2012-06-01 LAB — HCG, QUANTITATIVE, PREGNANCY: hCG, Beta Chain, Quant, S: 3672 m[IU]/mL — ABNORMAL HIGH (ref <5)

## 2012-06-01 NOTE — MAU Provider Note (Addendum)
Chief Complaint: Possible Pregnancy, Abdominal Pain and Vaginal Discharge   None    SUBJECTIVE HPI: Rhonda Howard is a 26 y.o. [id number] at [redacted]w[redacted]d by LMP who presents to maternity admissions reporting abdominal cramping and pain x1 week.  The pain is described as her entire lower abdomen but it is a little more on the left lower side.  She reports an increase in discharge but no vaginal itching/burning/odor, and she denies vaginal bleeding, urinary symptoms, h/a, dizziness, n/v, or fever/chills.     Past Medical History  Diagnosis Date  . No pertinent past medical history    Past Surgical History  Procedure Date  . No past surgeries    History   Social History  . Marital Status: Single    Spouse Name: N/A    Number of Children: N/A  . Years of Education: N/A   Occupational History  . Not on file.   Social History Main Topics  . Smoking status: Never Smoker   . Smokeless tobacco: Not on file  . Alcohol Use: No  . Drug Use: No  . Sexually Active: Yes   Other Topics Concern  . Not on file   Social History Narrative  . No narrative on file   No current facility-administered medications on file prior to encounter.   No current outpatient prescriptions on file prior to encounter.   No Known Allergies  ROS: Pertinent items in HPI  OBJECTIVE Blood pressure 110/70, pulse 82, temperature 99 F (37.2 C), temperature source Oral, resp. rate 16, height 5' 5.5" (1.664 m), weight 80.559 kg (177 lb 9.6 oz), last menstrual period 04/25/2012, SpO2 100.00%. GENERAL: Well-developed, well-nourished female in no acute distress.  HEENT: Normocephalic HEART: normal rate RESP: normal effort ABDOMEN: Soft, non-tender EXTREMITIES: Nontender, no edema NEURO: Alert and oriented Pelvic exam: Cervix pink, visually closed, without lesion, scant white creamy discharge, vaginal walls and external genitalia normal Bimanual exam: Cervix 0/long/high, firm, anterior, mild CMT, uterus  Mildly tender,  nonenlarged, adnexa without tenderness, enlargement, or mass  LAB RESULTS Results for orders placed during the hospital encounter of 06/01/12 (from the past 24 hour(s))  URINALYSIS, ROUTINE W REFLEX MICROSCOPIC     Status: Normal   Collection Time   06/01/12 12:02 PM      Component Value Range   Color, Urine YELLOW  YELLOW   APPearance CLEAR  CLEAR   Specific Gravity, Urine 1.025  1.005 - 1.030   pH 6.0  5.0 - 8.0   Glucose, UA NEGATIVE  NEGATIVE mg/dL   Hgb urine dipstick NEGATIVE  NEGATIVE   Bilirubin Urine NEGATIVE  NEGATIVE   Ketones, ur NEGATIVE  NEGATIVE mg/dL   Protein, ur NEGATIVE  NEGATIVE mg/dL   Urobilinogen, UA 1.0  0.0 - 1.0 mg/dL   Nitrite NEGATIVE  NEGATIVE   Leukocytes, UA NEGATIVE  NEGATIVE    PLAN Quant HCG, wet prep, GCC, OB U/S Care assumed by Thressa Sheller, CNM   Sharen Counter Certified Nurse-Midwife 06/01/2012  12:35 PM  A: Ultrasound reveals an intrauterine pregnancy that is AGA.  P: Discharge home 1st trimester danger signs given Will establish care with a primary OB.

## 2012-06-01 NOTE — MAU Provider Note (Signed)
Attestation of Attending Supervision of Advanced Practitioner (CNM/NP): Evaluation and management procedures were performed by the Advanced Practitioner under my supervision and collaboration.  I have reviewed the Advanced Practitioner's note and chart, and I agree with the management and plan.  Nazire Fruth, MD, FACOG Attending Obstetrician & Gynecologist Faculty Practice, [facility]  

## 2012-06-01 NOTE — MAU Note (Signed)
Patient states she had a positive home pregnancy test yesterday. Has been having abdominal cramping and a vaginal discharge with itching.

## 2012-06-01 NOTE — MAU Provider Note (Signed)
Attestation of Attending Supervision of Advanced Practitioner (CNM/NP): Evaluation and management procedures were performed by the Advanced Practitioner under my supervision and collaboration.  I have reviewed the Advanced Practitioner's note and chart, and I agree with the management and plan.  Natalyah Cummiskey, MD, FACOG Attending Obstetrician & Gynecologist Faculty Practice, [facility]   

## 2012-06-02 LAB — GC/CHLAMYDIA PROBE AMP, GENITAL
Chlamydia, DNA Probe: POSITIVE — AB
GC Probe Amp, Genital: NEGATIVE

## 2012-06-30 ENCOUNTER — Encounter (HOSPITAL_COMMUNITY): Payer: Self-pay

## 2012-06-30 ENCOUNTER — Emergency Department (HOSPITAL_COMMUNITY)
Admission: EM | Admit: 2012-06-30 | Discharge: 2012-07-01 | Disposition: A | Discharge status: Home / Self Care | Payer: Medicaid Other | Attending: Emergency Medicine | Admitting: Emergency Medicine

## 2012-06-30 DIAGNOSIS — O210 Mild hyperemesis gravidarum: Secondary | ICD-10-CM | POA: Insufficient documentation

## 2012-06-30 LAB — CBC WITH DIFFERENTIAL/PLATELET
Basophils Relative: 0 % (ref 0–1)
Eosinophils Absolute: 0.3 10*3/uL (ref 0.0–0.7)
MCH: 31.4 pg (ref 26.0–34.0)
MCHC: 35.5 g/dL (ref 30.0–36.0)
Neutrophils Relative %: 33 % — ABNORMAL LOW (ref 43–77)
Platelets: 224 10*3/uL (ref 150–400)
RDW: 11.4 % — ABNORMAL LOW (ref 11.5–15.5)

## 2012-06-30 LAB — COMPREHENSIVE METABOLIC PANEL
ALT: 7 U/L (ref 0–35)
Albumin: 3.6 g/dL (ref 3.5–5.2)
Alkaline Phosphatase: 50 U/L (ref 39–117)
Calcium: 9.3 mg/dL (ref 8.4–10.5)
Potassium: 3.5 mEq/L (ref 3.5–5.1)
Sodium: 134 mEq/L — ABNORMAL LOW (ref 135–145)
Total Protein: 7.2 g/dL (ref 6.0–8.3)

## 2012-06-30 LAB — URINE MICROSCOPIC-ADD ON

## 2012-06-30 LAB — URINALYSIS, ROUTINE W REFLEX MICROSCOPIC
Glucose, UA: NEGATIVE mg/dL
Ketones, ur: NEGATIVE mg/dL
Specific Gravity, Urine: 1.024 (ref 1.005–1.030)
pH: 7 (ref 5.0–8.0)

## 2012-06-30 MED ORDER — ONDANSETRON HCL 4 MG PO TABS: 4.0000 mg | ORAL_TABLET | Freq: Four times a day (QID) | ORAL | Status: DC

## 2012-06-30 MED ORDER — SODIUM CHLORIDE 0.9 % IV BOLUS (SEPSIS)
1000.0000 mL | Freq: Once | INTRAVENOUS | Status: AC
  Administered 2012-06-30: 1000 mL via INTRAVENOUS

## 2012-06-30 MED ORDER — ONDANSETRON HCL 4 MG/2ML IJ SOLN
4.0000 mg | Freq: Once | INTRAMUSCULAR | Status: AC
  Administered 2012-06-30: 4 mg via INTRAVENOUS
  Filled 2012-06-30 (×2): qty 2

## 2012-06-30 NOTE — ED Notes (Signed)
Pt reports she is [redacted] weeks pregnant, pt reports she vomits daily, unable to keep food down, feeling dizzy and dehydrated x9 weeks. Pt denies having an OBGYN. Pt reports lower abd "cramping," denies vaginal bleeding, d/c, or odor

## 2012-06-30 NOTE — ED Provider Notes (Signed)
History     CSN: [id number]  Arrival date & time 06/30/12  2003   First MD Initiated Contact with Patient 06/30/12 2217      Chief Complaint  Patient presents with  . Emesis During Pregnancy    (Consider location/radiation/quality/duration/timing/severity/associated sxs/prior treatment) HPI  generally healthy G63 P8 26 year old female who is [redacted] weeks pregnant presents complaining of persistent vomiting. patient reports since she found out that she was pregnant she has been having persistent nausea, and vomiting for the past several weeks. The symptoms worsen whenever she eats or drink anything. She reports vomiting up to 5 times per day. The onset was gradual, persistent, moderate in severity, and unrelieved with drinking ginger ale and crackers. She occasionally endorse abdominal pain from her vomiting. Currently denies any abdominal pain at this time. She denies fever, chills, chest pain, shortness of breath, abnormal vaginal bleeding, or vaginal discharge.  Past Medical History  Diagnosis Date  . No pertinent past medical history     Past Surgical History  Procedure Date  . No past surgeries     History reviewed. No pertinent family history.  History  Substance Use Topics  . Smoking status: Never Smoker   . Smokeless tobacco: Not on file  . Alcohol Use: No    OB History    Grav Para Term Preterm Abortions TAB SAB Ect Mult Living   5 4 4       4       Review of Systems  All other systems reviewed and are negative.    Allergies  Review of patient's allergies indicates no known allergies.  Home Medications  No current outpatient prescriptions on file.  BP 116/76  Pulse 99  Temp 98.6 F (37 C) (Oral)  Resp 16  SpO2 100%  LMP 04/25/2012  Physical Exam  Nursing note and vitals reviewed. Constitutional: She is oriented to person, place, and time. She appears well-developed and well-nourished. No distress.       Awake, alert, nontoxic appearance  HENT:    Head: Atraumatic.  Mouth/Throat: Oropharynx is clear and moist.  Eyes: Conjunctivae normal are normal. Right eye exhibits no discharge. Left eye exhibits no discharge.  Neck: Neck supple.  Cardiovascular: Normal rate and regular rhythm.   Pulmonary/Chest: Effort normal. No respiratory distress. She exhibits no tenderness.  Abdominal: Soft. Bowel sounds are normal. She exhibits no distension. There is no tenderness. There is no rebound and no guarding.  Musculoskeletal: Normal range of motion. She exhibits no tenderness.       ROM appears intact, no obvious focal weakness  Neurological: She is alert and oriented to person, place, and time.       Mental status and motor strength appears intact  Skin: No rash noted.  Psychiatric: She has a normal mood and affect.    ED Course  Procedures (including critical care time)  Labs Reviewed  CBC WITH DIFFERENTIAL - Abnormal; Notable for the following:    RBC 3.63 (*)     Hemoglobin 11.4 (*)     HCT 32.1 (*)     RDW 11.4 (*)     Neutrophils Relative 33 (*)     Lymphocytes Relative 54 (*)     All other components within normal limits  COMPREHENSIVE METABOLIC PANEL - Abnormal; Notable for the following:    Sodium 134 (*)     All other components within normal limits  URINALYSIS, ROUTINE W REFLEX MICROSCOPIC - Abnormal; Notable for the following:    Leukocytes,  UA SMALL (*)     All other components within normal limits  POCT PREGNANCY, URINE - Abnormal; Notable for the following:    Preg Test, Ur POSITIVE (*)     All other components within normal limits  URINE MICROSCOPIC-ADD ON - Abnormal; Notable for the following:    Squamous Epithelial / LPF FEW (*)     Bacteria, UA FEW (*)     All other components within normal limits  URINE CULTURE   No results found.   No diagnosis found.  1. Hyperemesis gravidarum   MDM  Hyperemesis gravidarum.  Oral mucosa moist.  No abd tenderness on exam.  Will check basic labs.  IVF and antiemetic  given.    10:34 PM Pt has normal electrolytes, UA shows no infection, preg test is positive, normal WBC.  Pt did vomit once in ED.     12:11 AM Report given to Dr. Norlene Campbell, who will dispo pt once able to tolerates PO.    BP 120/80  Pulse 81  Temp 97.4 F (36.3 C) (Oral)  Resp 16  SpO2 99%  LMP 04/25/2012  I have reviewed nursing notes and vital signs.  I reviewed available ER/hospitalization records thought the EMR       Fayrene Helper, [place] 07/01/12 0011

## 2012-07-01 NOTE — ED Provider Notes (Signed)
Medical screening examination/treatment/procedure(s) were performed by non-physician practitioner and as supervising physician I was immediately available for consultation/collaboration.   Loren Racer, MD 07/01/12 [phone]

## 2012-07-02 LAB — URINE CULTURE

## 2012-09-02 NOTE — L&D Delivery Note (Signed)
Delivery Note At 12:59 PM a viable female was delivered via  (Presentation: ;  ).  APGAR: , ; weight .   Placenta status: , .  Cord:  with the following complications: .  Cord pH: not done  Anesthesia:   Episiotomy:  Lacerations:  Suture Repair: 2.0 Est. Blood Loss (mL):   Mom to postpartum.  Baby to nursery-stable.  Shalom Ware A 01/30/2013, 1:11 PM

## 2012-09-30 ENCOUNTER — Inpatient Hospital Stay (HOSPITAL_COMMUNITY)
Admission: EM | Admit: 2012-09-30 | Discharge: 2012-09-30 | Disposition: A | Discharge status: Home / Self Care | Payer: Medicaid Other | Attending: Obstetrics and Gynecology | Admitting: Obstetrics and Gynecology

## 2012-09-30 ENCOUNTER — Encounter (HOSPITAL_COMMUNITY): Payer: Self-pay | Admitting: Family Medicine

## 2012-09-30 DIAGNOSIS — E869 Volume depletion, unspecified: Secondary | ICD-10-CM

## 2012-09-30 DIAGNOSIS — R109 Unspecified abdominal pain: Secondary | ICD-10-CM

## 2012-09-30 DIAGNOSIS — B379 Candidiasis, unspecified: Secondary | ICD-10-CM

## 2012-09-30 DIAGNOSIS — B373 Candidiasis of vulva and vagina: Secondary | ICD-10-CM | POA: Insufficient documentation

## 2012-09-30 DIAGNOSIS — B3731 Acute candidiasis of vulva and vagina: Secondary | ICD-10-CM | POA: Insufficient documentation

## 2012-09-30 DIAGNOSIS — K922 Gastrointestinal hemorrhage, unspecified: Secondary | ICD-10-CM

## 2012-09-30 DIAGNOSIS — O0930 Supervision of pregnancy with insufficient antenatal care, unspecified trimester: Secondary | ICD-10-CM | POA: Insufficient documentation

## 2012-09-30 DIAGNOSIS — O212 Late vomiting of pregnancy: Secondary | ICD-10-CM | POA: Insufficient documentation

## 2012-09-30 DIAGNOSIS — O99891 Other specified diseases and conditions complicating pregnancy: Secondary | ICD-10-CM | POA: Insufficient documentation

## 2012-09-30 DIAGNOSIS — O2390 Unspecified genitourinary tract infection in pregnancy, unspecified trimester: Secondary | ICD-10-CM | POA: Insufficient documentation

## 2012-09-30 DIAGNOSIS — N760 Acute vaginitis: Secondary | ICD-10-CM | POA: Insufficient documentation

## 2012-09-30 DIAGNOSIS — R197 Diarrhea, unspecified: Secondary | ICD-10-CM | POA: Insufficient documentation

## 2012-09-30 DIAGNOSIS — B9789 Other viral agents as the cause of diseases classified elsewhere: Secondary | ICD-10-CM | POA: Insufficient documentation

## 2012-09-30 DIAGNOSIS — B9689 Other specified bacterial agents as the cause of diseases classified elsewhere: Secondary | ICD-10-CM

## 2012-09-30 HISTORY — DX: Anemia, unspecified: D64.9

## 2012-09-30 LAB — URINALYSIS, ROUTINE W REFLEX MICROSCOPIC
Bilirubin Urine: NEGATIVE
Leukocytes, UA: NEGATIVE
Nitrite: NEGATIVE
Specific Gravity, Urine: 1.015 (ref 1.005–1.030)
Urobilinogen, UA: 0.2 mg/dL (ref 0.0–1.0)
pH: 7.5 (ref 5.0–8.0)

## 2012-09-30 MED ORDER — ONDANSETRON 4 MG PO TBDP: 4.0000 mg | ORAL_TABLET | Freq: Once | ORAL | Status: DC

## 2012-09-30 MED ORDER — ONDANSETRON 4 MG PO TBDP
4.0000 mg | ORAL_TABLET | Freq: Once | ORAL | Status: AC
  Administered 2012-09-30: 4 mg via ORAL
  Filled 2012-09-30: qty 1

## 2012-09-30 MED ORDER — FLUCONAZOLE 150 MG PO TABS
150.0000 mg | ORAL_TABLET | Freq: Once | ORAL | Status: AC
  Administered 2012-09-30: 150 mg via ORAL
  Filled 2012-09-30: qty 1

## 2012-09-30 MED ORDER — METRONIDAZOLE 500 MG PO TABS: 500.0000 mg | ORAL_TABLET | Freq: Two times a day (BID) | ORAL | Status: DC

## 2012-09-30 NOTE — MAU Note (Signed)
Received from [facility] via [facility]. Abdominal pain started this AM., had several episodes of  vomiting and diarrhea. Then felt a gush of fluid from vagina, not sure if it was urine or amniotic fluid. Went to hospital because of the gush of fluid. States was told she was dilated 1cm.

## 2012-09-30 NOTE — ED Provider Notes (Signed)
I saw and evaluated the patient, reviewed the resident's note and I agree with the findings and plan.  see previous note  Shelda Jakes, MD 09/30/12 [phone]

## 2012-09-30 NOTE — ED Notes (Signed)
Pt reports this is her 5th pregnancy. States "it doesn't feel like normal contractions". Reports "gush" of fluid prior to arrival, does not know if urine. No burning or problems with urination. Has not had prenatal care.

## 2012-09-30 NOTE — ED Notes (Signed)
Patient states she is [redacted] weeks pregnant with no prenatal care.  C/o lower abdominal pain, N&V External monitors applied + FHT Visual fetal movement

## 2012-09-30 NOTE — ED Notes (Signed)
Gwenette Greet MAU RN notified of patients transfer to [facility].

## 2012-09-30 NOTE — ED Notes (Signed)
Rapid response OB RN at bedside.  

## 2012-09-30 NOTE — ED Provider Notes (Signed)
First Provider Initiated Contact with Patient 09/30/12 1801      Chief Complaint:  Possible ROM  Rhonda Howard is  27 y.o. [id number] at [redacted]w[redacted]d presents complaining of a gush of fluid from the vagina this am, none since.  C/o increased discharge.  Was sent from [facility], diagnosed with probable GI bug d/t N/V/D.  No PNC, but plans to see [facility] next month Obstetrical/Gynecological History: Menstrual History: OB History    Grav Para Term Preterm Abortions TAB SAB Ect Mult Living   5 4 4       4        Patient's last menstrual period was 04/25/2012.     Past Medical History: Past Medical History  Diagnosis Date  . Anemia     Past Surgical History: Past Surgical History  Procedure Date  . Vaginal delivery     x 4    Family History: History reviewed. No pertinent family history.  Social History: History  Substance Use Topics  . Smoking status: Never Smoker   . Smokeless tobacco: Never Used  . Alcohol Use: No    Allergies: No Known Allergies  Meds:  Prescriptions prior to admission  Medication Sig Dispense Refill  . Prenatal Multivit-Min-Fe-FA (PRE-NATAL FORMULA) TABS Take 1 tablet by mouth daily.        Review of Systems - Please refer to the aforementioned patients' reports.     Physical Exam  Blood pressure 112/73, pulse 112, temperature 98.8 F (37.1 C), temperature source Oral, resp. rate 16, last menstrual period 04/25/2012, SpO2 100.00%. GENERAL: Well-developed, well-nourished female in no acute distress.  LUNGS: Clear to auscultation bilaterally.  HEART: Regular rate and rhythm. ABDOMEN: Soft, nontender, nondistended, gravid.  EXTREMITIES: Nontender, no edema, 2+ distal pulses. CERVICAL EXAM:deferred (was checked at [facility] by [facility] RN and was 1cm/ multiparous  SSE:  Yeast appearing discharge; no pooling; valsalva negative Labs:  Wet prep: clue cells and yeast Fern negative Recent Results (from the past 24 hour(s))  URINALYSIS, ROUTINE W REFLEX MICROSCOPIC   Collection Time   09/30/12  5:05 PM      Component Value Range   Color, Urine YELLOW  YELLOW   APPearance CLEAR  CLEAR   Specific Gravity, Urine 1.015  1.005 - 1.030   pH 7.5  5.0 - 8.0   Glucose, UA NEGATIVE  NEGATIVE mg/dL   Hgb urine dipstick NEGATIVE  NEGATIVE   Bilirubin Urine NEGATIVE  NEGATIVE   Ketones, ur 15 (*) NEGATIVE mg/dL   Protein, ur NEGATIVE  NEGATIVE mg/dL   Urobilinogen, UA 0.2  0.0 - 1.0 mg/dL   Nitrite NEGATIVE  NEGATIVE   Leukocytes, UA NEGATIVE  NEGATIVE   Imaging Studies:  No results found.  Assessment: Rhonda Howard is  27 y.o. [id number] at [redacted]w[redacted]d presents with GI virus; yeast/bacterial vaginosis; no evidence of ROM.  Plan: Treat with Zofran, Diflucan, and Flagyl (to be started when GI symptoms resolve)  CRESENZO-DISHMAN,Lonie Rummell 1/29/20146:25 PM

## 2012-09-30 NOTE — ED Notes (Addendum)
Per pt sts diarrhea, abdominal pain and a gush of fluid 15 to 20 min before arrival. Pt [redacted] weeks pregnant. sts she is feeling contractions and hasn't felt the baby move much

## 2012-09-30 NOTE — ED Notes (Signed)
Notified Dr. Debroah Loop of patients status Patient to transfer to MAU at [facility] for further evaluation EFM reactive No contractions noted

## 2012-09-30 NOTE — ED Provider Notes (Signed)
History     CSN: [id number]  Arrival date & time 09/30/12  1420   First MD Initiated Contact with Patient 09/30/12 1515      Chief Complaint  Patient presents with  . Abdominal Pain    (Consider location/radiation/quality/duration/timing/severity/associated sxs/prior treatment) HPI Comments: 27 y/o F G5P4004 at 22 weeks p/w abdominal pain and loss of fluid. Patient with mild diffuse abd pain yesterday. Today with worsening of pain. Woke from sleep at about 3AM. Significantly improved with emesis and diarrhea (NB). Episode recurred few hours later. Intermittent emesis and diarrhea through day. Just prior to coming to hospital, patient felt some fluid loss during emesis. Uncertain if this came per vagina or bladder. No vaginal bleeding. Got very anxious about this. Briefly experience anxiety, SOB, and possible decrease fetal movement. This resolved shortly after. Since then with normal FM. Reports 1 contraction earlier in today. None since. Occasional LH.  No dizziness or blurry vision. No RUQ pain. No urinary complaints.  Patient is a 27 y.o. female presenting with abdominal pain. The history is provided by the patient.  Abdominal Pain The primary symptoms of the illness include abdominal pain, shortness of breath (resolved), nausea, vomiting and diarrhea. The primary symptoms of the illness do not include fever or dysuria. The current episode started yesterday. The onset of the illness was gradual. The problem has been gradually worsening.  The abdominal pain began yesterday. The pain came on gradually. The abdominal pain has been gradually worsening since its onset. The abdominal pain is generalized. The abdominal pain does not radiate. Pain scale: moderate. The abdominal pain is relieved by vomiting and bowel movement. Exacerbated by: nothing.  Associated with: pregnant. The patient states that she believes she is currently pregnant. The patient has had a change in bowel habit. Symptoms  associated with the illness do not include chills, urgency, hematuria, frequency or back pain.    Past Medical History  Diagnosis Date  . No pertinent past medical history     Past Surgical History  Procedure Date  . No past surgeries     History reviewed. No pertinent family history.  History  Substance Use Topics  . Smoking status: Never Smoker   . Smokeless tobacco: Not on file  . Alcohol Use: No    OB History    Grav Para Term Preterm Abortions TAB SAB Ect Mult Living   5 4 4       4       Review of Systems  Constitutional: Negative for fever and chills.  HENT: Negative for congestion and rhinorrhea.   Eyes: Negative for pain and visual disturbance.  Respiratory: Positive for shortness of breath (resolved). Negative for cough.   Cardiovascular: Negative for chest pain and leg swelling.  Gastrointestinal: Positive for nausea, vomiting, abdominal pain and diarrhea.  Genitourinary: Negative for dysuria, urgency, frequency, hematuria, flank pain and difficulty urinating.  Musculoskeletal: Negative for back pain.  Skin: Negative for color change and rash.  Neurological: Positive for light-headedness. Negative for dizziness and headaches.  All other systems reviewed and are negative.    Allergies  Review of patient's allergies indicates no known allergies.  Home Medications   Current Outpatient Rx  Name  Route  Sig  Dispense  Refill  . PRE-NATAL FORMULA PO TABS   Oral   Take 1 tablet by mouth daily.           BP 114/76  Pulse 100  Temp 98.3 F (36.8 C) (Oral)  Resp 16  SpO2 100%  LMP 04/25/2012  Physical Exam  Nursing note and vitals reviewed. Constitutional: She is oriented to person, place, and time. She appears well-developed and well-nourished. No distress.  HENT:  Head: Normocephalic and atraumatic.  Eyes: Conjunctivae normal are normal. Right eye exhibits no discharge. Left eye exhibits no discharge.  Neck: No tracheal deviation present.    Cardiovascular: Normal rate, regular rhythm, normal heart sounds and intact distal pulses.   Pulmonary/Chest: Effort normal and breath sounds normal. No stridor. No respiratory distress. She has no wheezes. She has no rales.  Abdominal: Soft. She exhibits no distension. There is tenderness (minimal, diffuse. Nothing focal to RUQ). There is no guarding.       gravid  Musculoskeletal: She exhibits no edema and no tenderness.  Neurological: She is alert and oriented to person, place, and time.  Skin: Skin is warm and dry.  Psychiatric: She has a normal mood and affect. Her behavior is normal.    ED Course  Procedures (including critical care time)  Labs Reviewed - No data to display No results found.   1. Abdominal pain   2. Fluid loss       MDM   27 y/o F p/w abd pain and loss of fluid. Symptoms since resolved.  OB nurse at bedside. Thus far, rhythm strip reassuring.  Exam performed by [facility] nurse with cervix open at 1cm. No fluid or bleeding.  Discussed with physician at [facility] who requests transfer. Dr. Debroah Loop. Patient transferred for further evaluation and management.   Labs and imaging reviewed by myself and considered in medical decision making if ordered. Imaging interpreted by radiology.   Discussed case with Dr. Deretha Emory who is in agreement with assessment and plan.      Stevie Kern, MD 09/30/12 [phone]

## 2012-09-30 NOTE — ED Notes (Signed)
Report given to [facility] to transfer patient to MAU at [facility] External monitors taken off

## 2012-09-30 NOTE — ED Provider Notes (Signed)
I saw and evaluated the patient, reviewed the resident's note and I agree with the findings and plan.   The patient seen by me. Patient [redacted] weeks pregnant. Some concern for leakage of amniotic fluid. Patient will be transferred over to labor and delivery service at [facility] hospital via [facility]. Also possible patient's symptoms could be related to to a viral GI type of. But due to fact she [redacted] weeks pregnant and this is her fifth pregnancy she has been accepted by Dr. all over there. Patient was evaluated here by the rapid response OB nurse as well.  Shelda Jakes, MD 09/30/12 [phone]

## 2012-10-05 NOTE — ED Provider Notes (Signed)
Attestation of Attending Supervision of Advanced Practitioner: Evaluation and management procedures were performed by the PA/NP/CNM/OB Fellow under my supervision/collaboration. Chart reviewed and agree with management and plan.  Tilda Burrow 10/05/2012 6:29 PM

## 2012-10-23 LAB — OB RESULTS CONSOLE HGB/HCT, BLOOD: Hemoglobin: 10.2 g/dL

## 2012-10-23 LAB — OB RESULTS CONSOLE HIV ANTIBODY (ROUTINE TESTING): HIV: NONREACTIVE

## 2012-10-23 LAB — OB RESULTS CONSOLE RPR: RPR: NONREACTIVE

## 2012-10-30 ENCOUNTER — Encounter: Payer: Self-pay | Admitting: *Deleted

## 2012-11-08 ENCOUNTER — Encounter: Payer: Self-pay | Admitting: *Deleted

## 2012-11-18 ENCOUNTER — Ambulatory Visit (INDEPENDENT_AMBULATORY_CARE_PROVIDER_SITE_OTHER): Payer: Medicaid Other | Admitting: *Deleted

## 2012-11-18 ENCOUNTER — Encounter: Payer: Self-pay | Admitting: Obstetrics & Gynecology

## 2012-11-18 DIAGNOSIS — O360121 Maternal care for anti-D [Rh] antibodies, second trimester, fetus 1: Secondary | ICD-10-CM

## 2012-11-18 DIAGNOSIS — O360990 Maternal care for other rhesus isoimmunization, unspecified trimester, not applicable or unspecified: Secondary | ICD-10-CM

## 2012-11-18 MED ORDER — RHO D IMMUNE GLOBULIN 1500 UNIT/2ML IJ SOLN
300.0000 ug | Freq: Once | INTRAMUSCULAR | Status: AC
  Administered 2012-11-18: 300 ug via INTRAMUSCULAR

## 2012-11-25 ENCOUNTER — Ambulatory Visit (INDEPENDENT_AMBULATORY_CARE_PROVIDER_SITE_OTHER): Payer: Medicaid Other | Admitting: Obstetrics & Gynecology

## 2012-11-25 ENCOUNTER — Encounter: Payer: Self-pay | Admitting: Obstetrics & Gynecology

## 2012-11-25 VITALS — BP 111/74 | Temp 97.0°F | Wt 220.0 lb

## 2012-11-25 DIAGNOSIS — Z3480 Encounter for supervision of other normal pregnancy, unspecified trimester: Secondary | ICD-10-CM

## 2012-11-25 LAB — POCT URINALYSIS DIPSTICK
Bilirubin, UA: NEGATIVE
Blood, UA: NEGATIVE
Ketones, UA: NEGATIVE
Leukocytes, UA: NEGATIVE
Spec Grav, UA: 1.01
pH, UA: 6

## 2012-11-25 NOTE — Progress Notes (Signed)
No complaints. Doing well.

## 2012-11-25 NOTE — Progress Notes (Signed)
Pulse: 104

## 2012-12-10 ENCOUNTER — Encounter: Payer: Medicaid Other | Admitting: Obstetrics

## 2012-12-14 ENCOUNTER — Telehealth: Payer: Self-pay | Admitting: *Deleted

## 2012-12-14 NOTE — Telephone Encounter (Signed)
Pt states she had some spotting with intercourse and is now having some cramping.

## 2012-12-14 NOTE — Telephone Encounter (Signed)
Pt states she had intercourse last night and noticed some blood in pants when she woke up this morning. Pt states she has not had any bleeding since then Pt states she is feeling her baby move. Pt states she is also having so cramping in her abdomen. Pt encourage to increase fluids and rest. Pt has a follow-up appointment this week. Pt encourage to keep this appointment and to hold off on intercourse until this appointment. Pt also encourage to try comfort measures such warm bath, heating pad and tylenol. Pt to go to MAU if pain worsens and/or becomes unbearable.

## 2012-12-16 ENCOUNTER — Ambulatory Visit (INDEPENDENT_AMBULATORY_CARE_PROVIDER_SITE_OTHER): Payer: Medicaid Other | Admitting: Obstetrics

## 2012-12-16 VITALS — BP 121/76 | Temp 98.1°F | Wt 219.0 lb

## 2012-12-16 DIAGNOSIS — Z3483 Encounter for supervision of other normal pregnancy, third trimester: Secondary | ICD-10-CM

## 2012-12-16 DIAGNOSIS — Z3480 Encounter for supervision of other normal pregnancy, unspecified trimester: Secondary | ICD-10-CM

## 2012-12-16 LAB — POCT URINALYSIS DIPSTICK
Blood, UA: NEGATIVE
Glucose, UA: NEGATIVE
Ketones, UA: NEGATIVE
Nitrite, UA: NEGATIVE
pH, UA: 6

## 2012-12-16 NOTE — Progress Notes (Signed)
Pulse: 99 

## 2012-12-17 ENCOUNTER — Encounter: Payer: Self-pay | Admitting: Obstetrics

## 2012-12-24 ENCOUNTER — Encounter: Payer: Self-pay | Admitting: Obstetrics

## 2012-12-31 ENCOUNTER — Encounter: Payer: Self-pay | Admitting: Obstetrics

## 2012-12-31 ENCOUNTER — Ambulatory Visit (INDEPENDENT_AMBULATORY_CARE_PROVIDER_SITE_OTHER): Payer: Medicaid Other | Admitting: Obstetrics

## 2012-12-31 VITALS — BP 108/71 | Temp 98.1°F | Wt 219.6 lb

## 2012-12-31 DIAGNOSIS — Z3483 Encounter for supervision of other normal pregnancy, third trimester: Secondary | ICD-10-CM

## 2012-12-31 DIAGNOSIS — Z3480 Encounter for supervision of other normal pregnancy, unspecified trimester: Secondary | ICD-10-CM

## 2012-12-31 LAB — POCT URINALYSIS DIPSTICK
Bilirubin, UA: NEGATIVE
Blood, UA: NEGATIVE
Glucose, UA: NEGATIVE
Nitrite, UA: NEGATIVE
Spec Grav, UA: 1.015
Urobilinogen, UA: NEGATIVE

## 2012-12-31 NOTE — Addendum Note (Signed)
Addended by: Julaine Hua on: 12/31/2012 05:36 PM   Modules accepted: Orders

## 2012-12-31 NOTE — Progress Notes (Signed)
Pulse: 86

## 2013-01-02 LAB — STREP B DNA PROBE: GBSP: POSITIVE

## 2013-01-15 ENCOUNTER — Encounter (HOSPITAL_COMMUNITY): Payer: Self-pay | Admitting: *Deleted

## 2013-01-15 ENCOUNTER — Inpatient Hospital Stay (HOSPITAL_COMMUNITY)
Admission: AD | Admit: 2013-01-15 | Discharge: 2013-01-15 | Disposition: A | Discharge status: Home / Self Care | Payer: Medicaid Other | Source: Ambulatory Visit | Attending: Obstetrics & Gynecology | Admitting: Obstetrics & Gynecology

## 2013-01-15 DIAGNOSIS — O99891 Other specified diseases and conditions complicating pregnancy: Secondary | ICD-10-CM | POA: Insufficient documentation

## 2013-01-15 DIAGNOSIS — R109 Unspecified abdominal pain: Secondary | ICD-10-CM | POA: Insufficient documentation

## 2013-01-15 NOTE — MAU Note (Signed)
PT SAYS  SHE WAS  AT [facility]- AND FELT  PRESSURE-  AT  745PM.   LAST APPOINTMENT  WAS 5-1-  NEXT APPOINTMENT  IS 5-21.   NO VE IN OFFICE .   DENIES HSV AND MRSA.

## 2013-01-21 ENCOUNTER — Ambulatory Visit (INDEPENDENT_AMBULATORY_CARE_PROVIDER_SITE_OTHER): Payer: Medicaid Other | Admitting: Obstetrics

## 2013-01-21 VITALS — Temp 97.8°F | Wt 221.2 lb

## 2013-01-21 DIAGNOSIS — Z3483 Encounter for supervision of other normal pregnancy, third trimester: Secondary | ICD-10-CM

## 2013-01-21 DIAGNOSIS — Z3480 Encounter for supervision of other normal pregnancy, unspecified trimester: Secondary | ICD-10-CM

## 2013-01-21 LAB — POCT URINALYSIS DIPSTICK
Bilirubin, UA: NEGATIVE
Nitrite, UA: NEGATIVE
Protein, UA: NEGATIVE
pH, UA: 8

## 2013-01-21 MED ORDER — OB COMPLETE PETITE 35-5-1-200 MG PO CAPS: 1.0000 | ORAL_CAPSULE | Freq: Every day | ORAL | Status: DC

## 2013-01-21 NOTE — Progress Notes (Signed)
Pulse- 93 Patient states that she is having contraction every 15 minutes.

## 2013-01-22 ENCOUNTER — Encounter: Payer: Self-pay | Admitting: Obstetrics

## 2013-01-29 ENCOUNTER — Ambulatory Visit (INDEPENDENT_AMBULATORY_CARE_PROVIDER_SITE_OTHER): Payer: Medicaid Other | Admitting: Obstetrics

## 2013-01-29 ENCOUNTER — Encounter: Payer: Self-pay | Admitting: Obstetrics

## 2013-01-29 VITALS — BP 96/66 | Temp 98.5°F | Wt 222.0 lb

## 2013-01-29 DIAGNOSIS — Z3480 Encounter for supervision of other normal pregnancy, unspecified trimester: Secondary | ICD-10-CM

## 2013-01-29 DIAGNOSIS — Z3483 Encounter for supervision of other normal pregnancy, third trimester: Secondary | ICD-10-CM

## 2013-01-29 LAB — POCT URINALYSIS DIPSTICK
Nitrite, UA: NEGATIVE
Protein, UA: NEGATIVE
Spec Grav, UA: 1.015
Urobilinogen, UA: NEGATIVE

## 2013-01-29 NOTE — Progress Notes (Signed)
Pulse 101 Patient c/o pelvic discomfort and sharp pains in thigh. She is requesting a cervix check.

## 2013-01-30 ENCOUNTER — Encounter (HOSPITAL_COMMUNITY): Payer: Self-pay | Admitting: *Deleted

## 2013-01-30 ENCOUNTER — Inpatient Hospital Stay (HOSPITAL_COMMUNITY)
Admission: AD | Admit: 2013-01-30 | Discharge: 2013-02-01 | DRG: 775 | Disposition: A | Discharge status: Home / Self Care | Payer: Medicaid Other | Source: Ambulatory Visit | Attending: Obstetrics | Admitting: Obstetrics

## 2013-01-30 DIAGNOSIS — O99892 Other specified diseases and conditions complicating childbirth: Principal | ICD-10-CM | POA: Diagnosis present

## 2013-01-30 DIAGNOSIS — Z22330 Carrier of Group B streptococcus: Secondary | ICD-10-CM

## 2013-01-30 LAB — CBC
HCT: 32.8 % — ABNORMAL LOW (ref 36.0–46.0)
Hemoglobin: 11.2 g/dL — ABNORMAL LOW (ref 12.0–15.0)
MCHC: 34.1 g/dL (ref 30.0–36.0)

## 2013-01-30 MED ORDER — WITCH HAZEL-GLYCERIN EX PADS: 1.0000 "application " | MEDICATED_PAD | CUTANEOUS | Status: DC | PRN

## 2013-01-30 MED ORDER — PRENATAL MULTIVITAMIN CH
1.0000 | ORAL_TABLET | Freq: Every day | ORAL | Status: DC
  Administered 2013-01-31: 1 via ORAL
  Filled 2013-01-30: qty 1

## 2013-01-30 MED ORDER — ONDANSETRON HCL 4 MG PO TABS: 4.0000 mg | ORAL_TABLET | ORAL | Status: DC | PRN

## 2013-01-30 MED ORDER — BUTORPHANOL TARTRATE 1 MG/ML IJ SOLN
1.0000 mg | INTRAMUSCULAR | Status: DC | PRN
Start: 2013-01-30 — End: 2013-01-30
  Administered 2013-01-30: 1 mg via INTRAVENOUS
  Filled 2013-01-30: qty 1

## 2013-01-30 MED ORDER — LACTATED RINGERS IV SOLN
INTRAVENOUS | Status: DC
  Administered 2013-01-30: 12:00:00 via INTRAVENOUS

## 2013-01-30 MED ORDER — ACETAMINOPHEN 325 MG PO TABS: 650.0000 mg | ORAL_TABLET | ORAL | Status: DC | PRN

## 2013-01-30 MED ORDER — OXYTOCIN BOLUS FROM INFUSION: 500.0000 mL | INTRAVENOUS | Status: DC

## 2013-01-30 MED ORDER — SIMETHICONE 80 MG PO CHEW: 80.0000 mg | CHEWABLE_TABLET | ORAL | Status: DC | PRN

## 2013-01-30 MED ORDER — SODIUM CHLORIDE 0.9 % IV SOLN
2.0000 g | Freq: Four times a day (QID) | INTRAVENOUS | Status: DC
  Administered 2013-01-30: 2 g via INTRAVENOUS
  Filled 2013-01-30 (×3): qty 2000

## 2013-01-30 MED ORDER — FERROUS SULFATE 325 (65 FE) MG PO TABS
325.0000 mg | ORAL_TABLET | Freq: Two times a day (BID) | ORAL | Status: DC
  Administered 2013-01-31 – 2013-02-01 (×3): 325 mg via ORAL
  Filled 2013-01-30 (×3): qty 1

## 2013-01-30 MED ORDER — ONDANSETRON HCL 4 MG/2ML IJ SOLN: 4.0000 mg | Freq: Four times a day (QID) | INTRAMUSCULAR | Status: DC | PRN

## 2013-01-30 MED ORDER — SENNOSIDES-DOCUSATE SODIUM 8.6-50 MG PO TABS
2.0000 | ORAL_TABLET | Freq: Every day | ORAL | Status: DC
  Administered 2013-01-30 – 2013-01-31 (×2): 2 via ORAL

## 2013-01-30 MED ORDER — LANOLIN HYDROUS EX OINT: TOPICAL_OINTMENT | CUTANEOUS | Status: DC | PRN

## 2013-01-30 MED ORDER — ZOLPIDEM TARTRATE 5 MG PO TABS: 5.0000 mg | ORAL_TABLET | Freq: Every evening | ORAL | Status: DC | PRN

## 2013-01-30 MED ORDER — OXYCODONE-ACETAMINOPHEN 5-325 MG PO TABS
1.0000 | ORAL_TABLET | ORAL | Status: DC | PRN
  Administered 2013-01-30: 2 via ORAL
  Administered 2013-01-30 – 2013-01-31 (×3): 1 via ORAL
  Administered 2013-01-31 – 2013-02-01 (×2): 2 via ORAL
  Filled 2013-01-30: qty 1
  Filled 2013-01-30: qty 2
  Filled 2013-01-30: qty 1
  Filled 2013-01-30: qty 2

## 2013-01-30 MED ORDER — CITRIC ACID-SODIUM CITRATE 334-500 MG/5ML PO SOLN: 30.0000 mL | ORAL | Status: DC | PRN

## 2013-01-30 MED ORDER — LIDOCAINE HCL (PF) 1 % IJ SOLN
30.0000 mL | INTRAMUSCULAR | Status: DC | PRN
  Filled 2013-01-30 (×2): qty 30

## 2013-01-30 MED ORDER — OXYCODONE-ACETAMINOPHEN 5-325 MG PO TABS
1.0000 | ORAL_TABLET | ORAL | Status: DC | PRN
  Filled 2013-01-30: qty 2
  Filled 2013-01-30: qty 1

## 2013-01-30 MED ORDER — IBUPROFEN 600 MG PO TABS
600.0000 mg | ORAL_TABLET | Freq: Four times a day (QID) | ORAL | Status: DC
  Administered 2013-01-30 – 2013-02-01 (×6): 600 mg via ORAL

## 2013-01-30 MED ORDER — BENZOCAINE-MENTHOL 20-0.5 % EX AERO: 1.0000 "application " | INHALATION_SPRAY | CUTANEOUS | Status: DC | PRN

## 2013-01-30 MED ORDER — LACTATED RINGERS IV SOLN: 500.0000 mL | INTRAVENOUS | Status: DC | PRN

## 2013-01-30 MED ORDER — DIPHENHYDRAMINE HCL 25 MG PO CAPS: 25.0000 mg | ORAL_CAPSULE | Freq: Four times a day (QID) | ORAL | Status: DC | PRN

## 2013-01-30 MED ORDER — ONDANSETRON HCL 4 MG/2ML IJ SOLN: 4.0000 mg | INTRAMUSCULAR | Status: DC | PRN

## 2013-01-30 MED ORDER — TETANUS-DIPHTH-ACELL PERTUSSIS 5-2.5-18.5 LF-MCG/0.5 IM SUSP
0.5000 mL | Freq: Once | INTRAMUSCULAR | Status: AC
  Administered 2013-02-01: 0.5 mL via INTRAMUSCULAR

## 2013-01-30 MED ORDER — IBUPROFEN 600 MG PO TABS
600.0000 mg | ORAL_TABLET | Freq: Four times a day (QID) | ORAL | Status: DC | PRN
  Administered 2013-01-30: 600 mg via ORAL
  Filled 2013-01-30 (×7): qty 1

## 2013-01-30 MED ORDER — OXYTOCIN 40 UNITS IN LACTATED RINGERS INFUSION - SIMPLE MED
62.5000 mL/h | INTRAVENOUS | Status: DC
  Filled 2013-01-30: qty 1000

## 2013-01-30 MED ORDER — DIBUCAINE 1 % RE OINT: 1.0000 "application " | TOPICAL_OINTMENT | RECTAL | Status: DC | PRN

## 2013-01-30 NOTE — MAU Note (Signed)
contractions started last night, worser this morning. No bleeding, no leaking.  Does not want epid, wants med

## 2013-01-30 NOTE — Plan of Care (Signed)
Problem: Consults Goal: Birthing Suites Patient Information Press F2 to bring up selections list Outcome: Completed/Met Date Met:  01/30/13  Pt 37-[redacted] weeks EGA     

## 2013-01-30 NOTE — H&P (Signed)
This is Dr. Francoise Ceo dictating the history and physical on  Rhonda Howard she's a 27 year old gravida 5 para 4004 at 40 weeks 513 114 positive GBS got ampicillin she was admitted in labor 6 cm 100% vertex -1 membranes ruptured spontaneously while in labor she made rapid progress a normal vaginal delivery of a female Apgar 73 placenta spontaneous no episiotomy or lacerations Past medical history negative Surgical history negative Social history negative System review negative Physical well-developed female post delivery HEENT negative Lungs clear to P&A Heart regular rhythm no murmurs no gallops Breasts negative Abdomen 20 week size postpartum Pelvic as described above Extremities negative

## 2013-01-31 LAB — CBC
HCT: 31.6 % — ABNORMAL LOW (ref 36.0–46.0)
Hemoglobin: 10.6 g/dL — ABNORMAL LOW (ref 12.0–15.0)
MCV: 94.3 fL (ref 78.0–100.0)
RDW: 12.9 % (ref 11.5–15.5)
WBC: 8.2 10*3/uL (ref 4.0–10.5)

## 2013-01-31 MED ORDER — RHO D IMMUNE GLOBULIN 1500 UNIT/2ML IJ SOLN
300.0000 ug | Freq: Once | INTRAMUSCULAR | Status: AC
  Administered 2013-01-31: 300 ug via INTRAMUSCULAR
  Filled 2013-01-31: qty 2

## 2013-01-31 NOTE — Progress Notes (Signed)
CSW met with MOB to complete assessment for LPNC at 30 weeks.  MOB states she started care at 25 weeks, which would not qualify her for an automatic CSW consult.  CSW cannot find PNR in MOB's chart.  CSW spoke to MOB about hospital drug screen policy and she states no concerns.  She reports starting care late due to being very sick with this pregnancy and busy with her 4 other children.  She reports no concerns with drug testing.  Baby's UDS is negative.  CSW will monitor meconium results.   

## 2013-01-31 NOTE — Progress Notes (Signed)
Patient ID: Rhonda Howard, female   DOB: [birth year], 27 y.o.   MRN: [id number] Postpartum day one Vital signs normal Fundus firm Legs negative Doing well

## 2013-02-01 LAB — RH IG WORKUP (INCLUDES ABO/RH)
Antibody Screen: NEGATIVE
Fetal Screen: NEGATIVE
Gestational Age(Wks): 40
Unit division: 0

## 2013-02-01 MED ORDER — OXYCODONE-ACETAMINOPHEN 5-325 MG PO TABS: 1.0000 | ORAL_TABLET | ORAL | Status: DC | PRN

## 2013-02-01 NOTE — Lactation Note (Signed)
This note was copied from the chart of Rhonda Eriona Kinchen. Lactation Consultation Note  Mom states br feeding is going very well; states that since yesterday baby has been feeding much better, states comfortable. Mom is experienced br feeder, states she plans to br feed for longer than her other babies (about 3 months).  Mom had some questions about storing her milk, discussed her questions. Enc mom to call lactation office if she has any concerns, and to attend the BFSG. Patient Name: Rhonda Howard [id number] Date: 02/01/2013     Maternal Data    Feeding Feeding Type: Breast Milk Feeding method: Breast Length of feed: 10 min  LATCH Score/Interventions                      Lactation Tools Discussed/Used     Consult Status      Lenard Forth 02/01/2013, 10:58 AM

## 2013-02-01 NOTE — Discharge Summary (Signed)
  Obstetric Discharg2e Summary Reason for Admission: onset of labor Prenatal Procedures: none Intrapartum Procedures: spontaneous vaginal delivery Postpartum Procedures: none Complications-Operative and Postpartum: none  Hemoglobin  Date Value Range Status  01/31/2013 10.6* 12.0 - 15.0 g/dL Final  4/54/0981 19.1   Final     HCT  Date Value Range Status  01/31/2013 31.6* 36.0 - 46.0 % Final  10/23/2012 30   Final    Physical Exam:  General: alert Lochia: appropriate Uterine: firm Incision: n/a DVT Evaluation: No evidence of DVT seen on physical exam.  Discharge Diagnoses: Active Problems:   Normal delivery   Discharge Information: Date: 02/01/2013 Activity: pelvic rest Diet: routine Medications:  Prior to Admission medications   Medication Sig Start Date End Date Taking? Authorizing Provider  oxyCODONE-acetaminophen (PERCOCET/ROXICET) 5-325 MG per tablet Take 1-2 tablets by mouth every 3 (three) hours as needed. 02/01/13   Antionette Char, MD    Condition: stable Instructions: refer to routine discharge instructions Discharge to: home Follow-up Information   Follow up with HARPER,CHARLES A, MD. Schedule an appointment as soon as possible for a visit in 3 weeks.   Contact information:   [address] [place] [address] [phone]       Newborn Data: Live born  Information for the patient's newborn:  Telecia, Larocque [[id number]]  female ; APGAR (1 MIN): 8   APGAR (5 MINS): 9    Home with mother.  Washburn-MOORE,Sherhonda Gaspar A 02/01/2013, 7:58 AM

## 2013-02-01 NOTE — Progress Notes (Signed)
Ur chart review completed.  

## 2013-02-09 ENCOUNTER — Encounter: Payer: Medicaid Other | Admitting: Obstetrics

## 2013-02-22 ENCOUNTER — Ambulatory Visit (INDEPENDENT_AMBULATORY_CARE_PROVIDER_SITE_OTHER): Payer: Medicaid Other | Admitting: Obstetrics

## 2013-02-22 ENCOUNTER — Encounter: Payer: Self-pay | Admitting: Obstetrics

## 2013-02-22 DIAGNOSIS — IMO0001 Reserved for inherently not codable concepts without codable children: Secondary | ICD-10-CM

## 2013-02-22 DIAGNOSIS — R52 Pain, unspecified: Secondary | ICD-10-CM

## 2013-02-22 DIAGNOSIS — Z3202 Encounter for pregnancy test, result negative: Secondary | ICD-10-CM

## 2013-02-22 MED ORDER — OXYCODONE-ACETAMINOPHEN 5-325 MG PO TABS: 1.0000 | ORAL_TABLET | ORAL | Status: DC | PRN

## 2013-02-22 MED ORDER — MEDROXYPROGESTERONE ACETATE 150 MG/ML IM SUSP: 150.0000 mg | INTRAMUSCULAR | Status: DC

## 2013-02-22 NOTE — Progress Notes (Signed)
Subjective:     Rhonda Howard is a 27 y.o. female who presents for a postpartum visit. She is 4 weeks postpartum following a spontaneous vaginal delivery. I have fully reviewed the prenatal and intrapartum course. The delivery was at 40 gestational weeks. Outcome: spontaneous vaginal delivery. Anesthesia: none. Postpartum course has been uncomplicated. Baby's course has been uncomplicated. Baby is feeding by both breast and bottle - Gerber Gentle. Bleeding no bleeding. Bowel function is normal. Bladder function is normal. Patient is not sexually active. Contraception method is none. Postpartum depression screening: negative.  The following portions of the patient's history were reviewed and updated as appropriate: allergies, current medications, past family history, past medical history, past social history, past surgical history and problem list.  Review of Systems Pertinent items are noted in HPI.   Objective:    BP 116/82  Pulse 78  Temp(Src) 98.3 F (36.8 C) (Oral)  Breastfeeding? Yes  General:  alert and no distress   Breasts:  inspection negative, no nipple discharge or bleeding, no masses or nodularity palpable  Lungs: not done  Heart:  normal apical impulse and RRR  Abdomen: normal findings: soft, non-tender   Vulva:  normal  Vagina: normal vagina  Cervix:  no lesions  Corpus: normal size, contour, position, consistency, mobility, non-tender  Adnexa:  no mass, fullness, tenderness  Rectal Exam: Not performed.        Assessment:     Normal postpartum exam. Pap smear not done at today's visit.    Counseling for contraceptive measures done.  Plan:    1. Contraception: abstinence 2. Wants Depo Provera 3. Follow up in: 1 day or as needed.  Start Depo. 4. Depo Provera Rx

## 2013-03-12 ENCOUNTER — Ambulatory Visit: Payer: Medicaid Other | Admitting: Obstetrics

## 2013-11-25 ENCOUNTER — Other Ambulatory Visit: Payer: Self-pay | Admitting: *Deleted

## 2013-11-25 DIAGNOSIS — Z7251 High risk heterosexual behavior: Secondary | ICD-10-CM

## 2013-11-25 DIAGNOSIS — IMO0001 Reserved for inherently not codable concepts without codable children: Secondary | ICD-10-CM

## 2013-11-25 MED ORDER — LEVONORGESTREL 1.5 MG PO TABS: 1.5000 mg | ORAL_TABLET | Freq: Once | ORAL | Status: DC

## 2013-11-25 MED ORDER — MEDROXYPROGESTERONE ACETATE 150 MG/ML IM SUSP: 150.0000 mg | INTRAMUSCULAR | Status: DC

## 2013-11-29 ENCOUNTER — Encounter: Payer: Self-pay | Admitting: Obstetrics & Gynecology

## 2013-12-09 ENCOUNTER — Encounter: Payer: Self-pay | Admitting: *Deleted

## 2013-12-09 ENCOUNTER — Other Ambulatory Visit (INDEPENDENT_AMBULATORY_CARE_PROVIDER_SITE_OTHER): Payer: Medicaid Other

## 2013-12-09 VITALS — BP 116/76 | HR 78 | Temp 98.4°F | Ht 66.0 in | Wt 198.0 lb

## 2013-12-09 DIAGNOSIS — IMO0001 Reserved for inherently not codable concepts without codable children: Secondary | ICD-10-CM

## 2013-12-09 DIAGNOSIS — Z3202 Encounter for pregnancy test, result negative: Secondary | ICD-10-CM

## 2013-12-09 LAB — POCT URINE PREGNANCY: Preg Test, Ur: NEGATIVE

## 2013-12-09 NOTE — Progress Notes (Unsigned)
Patient states her last cycle started on December 02, 2013. Patient states she is currently sexually active and is not using any protection. Patient states the last time she had unprotected sexual intercourse was this morning. UPT was preformed today, results were negative. Patient notified that we would not be able to give the injection today due to Nursing Protocol. Patient advised to abstain from intercourse for the next 3 weeks since she had intercourse this morning and that she could come back for another UPT and to receive her injection. Patient notified that we could hold on to her Depo until her next visit or she could hold on to it. Patient requested that we hold on to it for her.

## 2013-12-23 ENCOUNTER — Encounter: Payer: Self-pay | Admitting: Obstetrics & Gynecology

## 2013-12-23 ENCOUNTER — Ambulatory Visit (INDEPENDENT_AMBULATORY_CARE_PROVIDER_SITE_OTHER): Payer: Medicaid Other | Admitting: Obstetrics & Gynecology

## 2013-12-23 VITALS — BP 120/77 | HR 77 | Temp 98.8°F | Ht 66.0 in | Wt 202.0 lb

## 2013-12-23 DIAGNOSIS — Z3009 Encounter for other general counseling and advice on contraception: Secondary | ICD-10-CM

## 2013-12-24 ENCOUNTER — Encounter: Payer: Self-pay | Admitting: Obstetrics & Gynecology

## 2013-12-24 NOTE — Progress Notes (Signed)
Pt left prior to being seen ?

## 2014-01-19 ENCOUNTER — Ambulatory Visit (INDEPENDENT_AMBULATORY_CARE_PROVIDER_SITE_OTHER): Payer: Medicaid Other | Admitting: Obstetrics

## 2014-01-19 ENCOUNTER — Encounter: Payer: Self-pay | Admitting: Obstetrics

## 2014-01-19 VITALS — BP 105/74 | HR 75 | Temp 98.9°F | Ht 66.0 in | Wt 196.0 lb

## 2014-01-19 DIAGNOSIS — N760 Acute vaginitis: Secondary | ICD-10-CM | POA: Insufficient documentation

## 2014-01-19 DIAGNOSIS — Z309 Encounter for contraceptive management, unspecified: Secondary | ICD-10-CM

## 2014-01-19 DIAGNOSIS — Z113 Encounter for screening for infections with a predominantly sexual mode of transmission: Secondary | ICD-10-CM

## 2014-01-19 DIAGNOSIS — Z3202 Encounter for pregnancy test, result negative: Secondary | ICD-10-CM

## 2014-01-19 LAB — POCT URINE PREGNANCY: Preg Test, Ur: NEGATIVE

## 2014-01-19 NOTE — Addendum Note (Signed)
Addended by: Odessa FlemingBOHNE, Reginold Beale M on: 01/19/2014 05:19 PM   Modules accepted: Orders

## 2014-01-19 NOTE — Progress Notes (Signed)
Patient ID: Rhonda Howard, female   DOB: [birth year], 28 y.o.   MRN: [id number]  Chief Complaint  Patient presents with  . Problem    HPI Rhonda Howard is a 28 y.o. female.  Desires STD labs.  No complaints.  HPI  Past Medical History  Diagnosis Date  . Anemia     Past Surgical History  Procedure Laterality Date  . Vaginal delivery      x 4  . No past surgeries      Family History  Problem Relation Age of Onset  . Diabetes Other   . Hypertension Other   . Lupus Other   . Hypertension Mother   . Stroke Father   . Hypertension Father   . Diabetes Brother     Social History History  Substance Use Topics  . Smoking status: Former Games developermoker  . Smokeless tobacco: Never Used     Comment: quit 2013  . Alcohol Use: Yes    No Known Allergies  Current Outpatient Prescriptions  Medication Sig Dispense Refill  . medroxyPROGESTERone (DEPO-PROVERA) 150 MG/ML injection Inject 1 mL (150 mg total) into the muscle every 3 (three) months.  1 mL  0   No current facility-administered medications for this visit.    Review of Systems Review of Systems Constitutional: negative for fatigue and weight loss Respiratory: negative for cough and wheezing Cardiovascular: negative for chest pain, fatigue and palpitations Gastrointestinal: negative for abdominal pain and change in bowel habits Genitourinary:negative Integument/breast: negative for nipple discharge Musculoskeletal:negative for myalgias Neurological: negative for gait problems and tremors Behavioral/Psych: negative for abusive relationship, depression Endocrine: negative for temperature intolerance     Blood pressure 105/74, pulse 75, temperature 98.9 F (37.2 C), height 5\' 6"  (1.676 m), weight 196 lb (88.905 kg), last menstrual period 01/01/2014, not currently breastfeeding.  Physical Exam Physical Exam General:   alert  Skin:   no rash or abnormalities  Lungs:   clear to auscultation bilaterally  Heart:   regular  rate and rhythm, S1, S2 normal, no murmur, click, rub or gallop  Breasts:   normal without suspicious masses, skin or nipple changes or axillary nodes  Abdomen:  normal findings: no organomegaly, soft, non-tender and no hernia  Pelvis:  External genitalia: normal general appearance Urinary system: urethral meatus normal and bladder without fullness, nontender Vaginal: normal without tenderness, induration or masses Cervix: normal appearance Adnexa: normal bimanual exam Uterus: anteverted and non-tender, normal size      Data Reviewed Labs  Assessment    BV     Plan  Await Affirm results.    Orders Placed This Encounter  Procedures  . HIV antibody  . Hepatitis B surface antigen  . RPR  . Hepatitis C antibody  . POCT urine pregnancy       Brock BadCharles A Harper 01/19/2014, 12:57 PM

## 2014-01-20 LAB — WET PREP BY MOLECULAR PROBE
CANDIDA SPECIES: NEGATIVE
GARDNERELLA VAGINALIS: POSITIVE — AB
TRICHOMONAS VAG: NEGATIVE

## 2014-01-20 LAB — HEPATITIS B SURFACE ANTIGEN: Hepatitis B Surface Ag: NEGATIVE

## 2014-01-20 LAB — HIV ANTIBODY (ROUTINE TESTING W REFLEX): HIV: NONREACTIVE

## 2014-01-20 LAB — GC/CHLAMYDIA PROBE AMP
CT PROBE, AMP APTIMA: NEGATIVE
GC PROBE AMP APTIMA: NEGATIVE

## 2014-01-20 LAB — RPR

## 2014-01-20 LAB — HEPATITIS C ANTIBODY: HCV AB: NEGATIVE

## 2014-01-25 ENCOUNTER — Other Ambulatory Visit: Payer: Self-pay | Admitting: *Deleted

## 2014-01-25 DIAGNOSIS — N760 Acute vaginitis: Principal | ICD-10-CM

## 2014-01-25 DIAGNOSIS — B9689 Other specified bacterial agents as the cause of diseases classified elsewhere: Secondary | ICD-10-CM

## 2014-01-25 MED ORDER — METRONIDAZOLE 500 MG PO TABS: 500.0000 mg | ORAL_TABLET | Freq: Two times a day (BID) | ORAL | Status: DC

## 2014-02-22 ENCOUNTER — Encounter: Payer: Self-pay | Admitting: Obstetrics

## 2014-02-22 ENCOUNTER — Ambulatory Visit (INDEPENDENT_AMBULATORY_CARE_PROVIDER_SITE_OTHER): Payer: Medicaid Other | Admitting: Obstetrics

## 2014-02-22 VITALS — BP 122/88 | HR 86 | Temp 97.6°F | Ht 66.0 in | Wt 192.0 lb

## 2014-02-22 DIAGNOSIS — Z30018 Encounter for initial prescription of other contraceptives: Secondary | ICD-10-CM

## 2014-02-22 DIAGNOSIS — B373 Candidiasis of vulva and vagina: Secondary | ICD-10-CM | POA: Insufficient documentation

## 2014-02-22 DIAGNOSIS — B3731 Acute candidiasis of vulva and vagina: Secondary | ICD-10-CM | POA: Insufficient documentation

## 2014-02-22 DIAGNOSIS — Z0000 Encounter for general adult medical examination without abnormal findings: Secondary | ICD-10-CM

## 2014-02-22 MED ORDER — FLUCONAZOLE 150 MG PO TABS: 150.0000 mg | ORAL_TABLET | Freq: Once | ORAL | Status: DC

## 2014-02-22 MED ORDER — MEDROXYPROGESTERONE ACETATE 150 MG/ML IM SUSP: 150.0000 mg | INTRAMUSCULAR | Status: DC

## 2014-02-22 NOTE — Progress Notes (Signed)
Subjective:     Rhonda Howard is a 28 y.o. female here for a routine exam.  Current complaints: None.    Personal health questionnaire:  Is patient [name], have a family history of breast and/or ovarian cancer: no Is there a family history of uterine cancer diagnosed at age < 8150, gastrointestinal cancer, urinary tract cancer, family member who is a Personnel officerLynch syndrome-associated carrier: no Is the patient overweight and hypertensive, family history of diabetes, personal history of gestational diabetes or PCOS: no Is patient over 8455, have PCOS,  family history of premature CHD under age 29, diabetes, smoke, have hypertension or peripheral artery disease:  no  The HPI was reviewed and explored in further detail by the provider. Gynecologic History Patient's last menstrual period was 02/07/2014. Contraception: Depo-Provera injections Last Pap: 2014. Results were: normal Last mammogram: n/a. Results were: n/a  Obstetric History OB History  Gravida Para Term Preterm AB SAB TAB Ectopic Multiple Living  5 5 5       5     # Outcome Date GA Lbr Len/2nd Weight Sex Delivery Anes PTL Lv  5 TRM 01/30/13 4432w0d 05:56 / 00:03 7 lb 4.2 oz (3.294 kg) F SVD None  Y  4 TRM 11/13/09 2332w0d  8 lb 15 oz (4.054 kg) M SVD None  Y  3 TRM 02/20/07 174w0d  8 lb 12 oz (3.969 kg) F SVD None  Y  2 TRM 12/05/05 304w0d  8 lb 12 oz (3.969 kg) F SVD None  Y  1 TRM 08/09/04 464w0d  6 lb 8 oz (2.948 kg) M SVD EPI  Y      Past Medical History  Diagnosis Date  . Anemia     Past Surgical History  Procedure Laterality Date  . Vaginal delivery      x 4  . No past surgeries      Current outpatient prescriptions:fluconazole (DIFLUCAN) 150 MG tablet, Take 1 tablet (150 mg total) by mouth once., Disp: 1 tablet, Rfl: 2;  medroxyPROGESTERone (DEPO-PROVERA) 150 MG/ML injection, Inject 1 mL (150 mg total) into the muscle every 3 (three) months., Disp: 1 mL, Rfl: 3 No Known Allergies  History  Substance Use Topics  .  Smoking status: Former Games developermoker  . Smokeless tobacco: Never Used     Comment: quit 2013  . Alcohol Use: No    Family History  Problem Relation Age of Onset  . Diabetes Other   . Hypertension Other   . Lupus Other   . Hypertension Mother   . Stroke Father   . Hypertension Father   . Diabetes Brother       Review of Systems  Constitutional: negative for fatigue and weight loss Respiratory: negative for cough and wheezing Cardiovascular: negative for chest pain, fatigue and palpitations Gastrointestinal: negative for abdominal pain and change in bowel habits Musculoskeletal:negative for myalgias Neurological: negative for gait problems and tremors Behavioral/Psych: negative for abusive relationship, depression Endocrine: negative for temperature intolerance   Genitourinary:negative for abnormal menstrual periods, genital lesions, hot flashes, sexual problems and vaginal discharge Integument/breast: negative for breast lump, breast tenderness, nipple discharge and skin lesion(s)    Objective:       BP 122/88  Pulse 86  Temp(Src) 97.6 F (36.4 C)  Ht 5\' 6"  (1.676 m)  Wt 192 lb (87.091 kg)  BMI 31.00 kg/m2  LMP 02/07/2014  Breastfeeding? No General:   alert  Skin:   no rash or abnormalities  Lungs:   clear to auscultation  bilaterally  Heart:   regular rate and rhythm, S1, S2 normal, no murmur, click, rub or gallop  Breasts:   normal without suspicious masses, skin or nipple changes or axillary nodes  Abdomen:  normal findings: no organomegaly, soft, non-tender and no hernia  Pelvis:  External genitalia: normal general appearance Urinary system: urethral meatus normal and bladder without fullness, nontender Vaginal: normal without tenderness, induration or masses Cervix: normal appearance Adnexa: normal bimanual exam Uterus: anteverted and non-tender, normal size   Lab Review Urine pregnancy test Labs reviewed yes Radiologic studies reviewed no    Assessment:     Healthy female exam.    Plan:    Contraception: Depo-Provera injections. Follow up in: 1 year.   Meds ordered this encounter  Medications  . fluconazole (DIFLUCAN) 150 MG tablet    Sig: Take 1 tablet (150 mg total) by mouth once.    Dispense:  1 tablet    Refill:  2  . medroxyPROGESTERone (DEPO-PROVERA) 150 MG/ML injection    Sig: Inject 1 mL (150 mg total) into the muscle every 3 (three) months.    Dispense:  1 mL    Refill:  3   Orders Placed This Encounter  Procedures  . WET PREP BY MOLECULAR PROBE  . GC/Chlamydia Probe Amp

## 2014-02-23 LAB — GC/CHLAMYDIA PROBE AMP
CT PROBE, AMP APTIMA: NEGATIVE
GC PROBE AMP APTIMA: NEGATIVE

## 2014-02-23 LAB — WET PREP BY MOLECULAR PROBE
Candida species: NEGATIVE
GARDNERELLA VAGINALIS: POSITIVE — AB
TRICHOMONAS VAG: NEGATIVE

## 2014-02-23 LAB — PAP IG W/ RFLX HPV ASCU

## 2014-03-11 ENCOUNTER — Telehealth: Payer: Self-pay | Admitting: *Deleted

## 2014-03-11 ENCOUNTER — Ambulatory Visit (INDEPENDENT_AMBULATORY_CARE_PROVIDER_SITE_OTHER): Payer: Medicaid Other | Admitting: *Deleted

## 2014-03-11 VITALS — BP 128/89 | HR 80 | Temp 97.9°F | Wt 195.0 lb

## 2014-03-11 DIAGNOSIS — Z30013 Encounter for initial prescription of injectable contraceptive: Secondary | ICD-10-CM

## 2014-03-11 DIAGNOSIS — Z3009 Encounter for other general counseling and advice on contraception: Secondary | ICD-10-CM

## 2014-03-11 MED ORDER — MEDROXYPROGESTERONE ACETATE 150 MG/ML IM SUSP
150.0000 mg | Freq: Once | INTRAMUSCULAR | Status: AC
  Administered 2014-03-11: 150 mg via INTRAMUSCULAR

## 2014-03-11 NOTE — Progress Notes (Signed)
Pt is in office today for Depo injection.  Pt states that she is currently on her menstral cycle.  Per phone note from J.Renae FicklePaul, RN, pt is advised to come in today for injection.  Injection given in right deltoid. Pt tolerated well.  Pt advised to RTO on 06/02/14 for next injection.

## 2014-03-11 NOTE — Telephone Encounter (Signed)
Patient states she was told to call with her cycle to get her Depo Provera-  Patient states she has started her cycle and would like to come today for her injection. LM on VM to CB- if cycle has started may come for injection.Please call to schedule.

## 2014-04-01 ENCOUNTER — Other Ambulatory Visit: Payer: Self-pay | Admitting: *Deleted

## 2014-04-01 DIAGNOSIS — B9689 Other specified bacterial agents as the cause of diseases classified elsewhere: Secondary | ICD-10-CM

## 2014-04-01 DIAGNOSIS — N760 Acute vaginitis: Principal | ICD-10-CM

## 2014-04-01 MED ORDER — TINIDAZOLE 500 MG PO TABS: 1.0000 g | ORAL_TABLET | Freq: Every day | ORAL | Status: DC

## 2014-04-01 NOTE — Progress Notes (Signed)
Pt made aware of lab results and Tinidazole sent to pharmacy.

## 2014-06-01 ENCOUNTER — Ambulatory Visit (INDEPENDENT_AMBULATORY_CARE_PROVIDER_SITE_OTHER): Payer: Medicaid Other | Admitting: *Deleted

## 2014-06-01 VITALS — BP 122/82 | HR 80 | Temp 98.3°F | Ht 66.0 in | Wt 184.0 lb

## 2014-06-01 DIAGNOSIS — Z3202 Encounter for pregnancy test, result negative: Secondary | ICD-10-CM

## 2014-06-01 DIAGNOSIS — Z3042 Encounter for surveillance of injectable contraceptive: Secondary | ICD-10-CM

## 2014-06-01 DIAGNOSIS — Z3049 Encounter for surveillance of other contraceptives: Secondary | ICD-10-CM

## 2014-06-01 LAB — POCT URINE PREGNANCY: PREG TEST UR: NEGATIVE

## 2014-06-01 MED ORDER — MEDROXYPROGESTERONE ACETATE 150 MG/ML IM SUSP
150.0000 mg | INTRAMUSCULAR | Status: AC
  Administered 2014-06-01 – 2015-02-16 (×4): 150 mg via INTRAMUSCULAR

## 2014-06-01 NOTE — Progress Notes (Signed)
Patient is in the office today for her DEPO Injection. Patient is on time for her Injection. Injection given in Right Deltoid. Patient tolerated well. Patient states she would like a pregnancy test and STD screening done today. UPT preformed, results were negative. Patient advised we would send GC/CH on her Urine and that her results would be back in 24 to 48 hours. Patient voiced understanding. Patient notified to schedule an appointment with the front for August 23, 2014 for her next Injection and to make sure she brought her injection with her. Patient voiced understanding.   BP 122/82  Pulse 80  Temp(Src) 98.3 F (36.8 C)  Ht 5\' 6"  (1.676 m)  Wt 184 lb (83.462 kg)  BMI 29.71 kg/m2  Results for orders placed in visit on 06/01/14 (from the past 24 hour(s))  POCT URINE PREGNANCY     Status: None   Collection Time    06/01/14 10:38 AM      Result Value Ref Range   Preg Test, Ur Negative     Administrations This Visit   medroxyPROGESTERone (DEPO-PROVERA) injection 150 mg   Administered Action Dose Route Administered By   06/01/2014 Given 150 mg Intramuscular Odessa FlemingKristina M Kaylem Gidney, LPN

## 2014-06-02 ENCOUNTER — Ambulatory Visit: Payer: Medicaid Other

## 2014-06-02 LAB — GC/CHLAMYDIA PROBE AMP
CT PROBE, AMP APTIMA: NEGATIVE
GC Probe RNA: NEGATIVE

## 2014-08-23 ENCOUNTER — Ambulatory Visit: Payer: Medicaid Other

## 2014-08-24 ENCOUNTER — Ambulatory Visit: Payer: Medicaid Other

## 2014-08-29 ENCOUNTER — Encounter: Payer: Self-pay | Admitting: *Deleted

## 2014-08-30 ENCOUNTER — Encounter: Payer: Self-pay | Admitting: Obstetrics & Gynecology

## 2014-08-30 ENCOUNTER — Ambulatory Visit (INDEPENDENT_AMBULATORY_CARE_PROVIDER_SITE_OTHER): Payer: Medicaid Other | Admitting: *Deleted

## 2014-08-30 VITALS — BP 118/79 | HR 71 | Temp 97.5°F | Ht 66.0 in | Wt 196.0 lb

## 2014-08-30 DIAGNOSIS — Z3042 Encounter for surveillance of injectable contraceptive: Secondary | ICD-10-CM

## 2014-08-30 NOTE — Progress Notes (Signed)
Patient is in the office today for her DEPO Injection. Patient is on time for her Injection. Injection given in right deltoid. Patient tolerated well. Patient asked for more information about the depo because she has been getting really bad headaches and that she bled the whole month of November. Patient notified that the bleeding could just be from her body adjusting to the medication and that once her body is adjusted her cycle should go back to normal or she shouldn't have a cycle at all. Patient voiced understanding. Patient notified that I would speak with Dr. Clearance CootsHarper about her headaches but that if her headaches got worse to call the office and we would have her come in. Patient voiced understanding. Patient notified to return November 22, 2014 for her next DEPO Injection. Patient voiced understanding.   BP 118/79 mmHg  Pulse 71  Temp(Src) 97.5 F (36.4 C)  Ht 5\' 6"  (1.676 m)  Wt 196 lb (88.905 kg)  BMI 31.65 kg/m2  LMP 07/06/2014  Administrations This Visit    medroxyPROGESTERone (DEPO-PROVERA) injection 150 mg    Administered Action Dose Route Administered By         08/30/2014 Given 150 mg Intramuscular Odessa FlemingKristina M Camera Krienke, LPN

## 2014-10-13 ENCOUNTER — Encounter (HOSPITAL_COMMUNITY): Payer: Self-pay | Admitting: *Deleted

## 2014-10-13 ENCOUNTER — Emergency Department (INDEPENDENT_AMBULATORY_CARE_PROVIDER_SITE_OTHER)
Admission: EM | Admit: 2014-10-13 | Discharge: 2014-10-13 | Disposition: A | Discharge status: Home / Self Care | Payer: Medicaid Other | Source: Home / Self Care | Attending: Family Medicine | Admitting: Family Medicine

## 2014-10-13 DIAGNOSIS — S91209A Unspecified open wound of unspecified toe(s) with damage to nail, initial encounter: Secondary | ICD-10-CM

## 2014-10-13 NOTE — ED Notes (Signed)
EMT applied bandaid and gave pt. another one to go.  Pt. told me it started to bleed through the first bandaid, so she applied the other one. Pt. Given 2 more bandaids to go.

## 2014-10-13 NOTE — ED Notes (Signed)
Pt  Reports    About  1  Month  She  Hit  Her  r  Big  Toe    On an  Object  At  Work    -  She  Noticed   Some  Discoloration  Of  The  Nailbed     -   And  Now  The  Nail  Appears  Loose

## 2014-10-13 NOTE — Discharge Instructions (Signed)
Return as needed

## 2014-10-13 NOTE — ED Provider Notes (Signed)
CSN: [id number]     Arrival date & time 10/13/14  1641 History   First MD Initiated Contact with Patient 10/13/14 1734     Chief Complaint  Patient presents with  . Nail Problem   (Consider location/radiation/quality/duration/timing/severity/associated sxs/prior Treatment) Patient is a 29 y.o. female presenting with foot injury. The history is provided by the patient.  Foot Injury Location:  Toe Injury: yes   Mechanism of injury comment:  Injury to toe at work, nail discoloration, now 2 nails present. Toe location:  R big toe Pain details:    Quality:  Sharp   Progression:  Improving Chronicity:  New Dislocation: no   Relieved by:  None tried Worsened by:  Nothing tried Ineffective treatments:  None tried Associated symptoms: no decreased ROM   Associated symptoms comment:  Partial nail avulsion of gt toenail, distal attachment.   Past Medical History  Diagnosis Date  . Anemia    Past Surgical History  Procedure Laterality Date  . Vaginal delivery      x 4  . No past surgeries     Family History  Problem Relation Age of Onset  . Diabetes Other   . Hypertension Other   . Lupus Other   . Hypertension Mother   . Stroke Father   . Hypertension Father   . Diabetes Brother    History  Substance Use Topics  . Smoking status: Former Games developermoker  . Smokeless tobacco: Never Used     Comment: quit 2013  . Alcohol Use: No   OB History    Gravida Para Term Preterm AB TAB SAB Ectopic Multiple Living   5 5 5       5      Review of Systems  Constitutional: Negative.   Musculoskeletal: Negative.   Skin: Positive for wound.    Allergies  Review of patient's allergies indicates no known allergies.  Home Medications   Prior to Admission medications   Medication Sig Start Date End Date Taking? Authorizing Provider  medroxyPROGESTERone (DEPO-PROVERA) 150 MG/ML injection Inject 1 mL (150 mg total) into the muscle every 3 (three) months. 02/22/14   Brock Badharles A Harper, MD   BP  132/78 mmHg  Pulse 72  Temp(Src) 98.6 F (37 C) (Oral)  Resp 20  SpO2 100% Physical Exam  Constitutional: She is oriented to person, place, and time. She appears well-developed and well-nourished.  Musculoskeletal: She exhibits tenderness.  Distal attahment of right gt toenail, 2nd new nail going normally. No erythema or signs of infection. Pt removed nail spont and withour diff, no other treatment needed.  Neurological: She is alert and oriented to person, place, and time.  Skin: Skin is warm and dry.  Nursing note and vitals reviewed.   ED Course  Procedures (including critical care time) Labs Review Labs Reviewed - No data to display  Imaging Review No results found.   MDM   1. Toenail avulsion, initial encounter        Linna HoffJames D Tram Wrenn, MD 10/13/14 1750

## 2014-11-22 ENCOUNTER — Ambulatory Visit (INDEPENDENT_AMBULATORY_CARE_PROVIDER_SITE_OTHER): Payer: Medicaid Other | Admitting: *Deleted

## 2014-11-22 ENCOUNTER — Ambulatory Visit: Payer: Medicaid Other

## 2014-11-22 VITALS — BP 121/77 | HR 84 | Temp 98.0°F | Wt 212.0 lb

## 2014-11-22 DIAGNOSIS — Z3049 Encounter for surveillance of other contraceptives: Secondary | ICD-10-CM

## 2014-11-22 DIAGNOSIS — Z3042 Encounter for surveillance of injectable contraceptive: Secondary | ICD-10-CM

## 2014-11-22 NOTE — Progress Notes (Signed)
Pt is in office for depo injection.  Pt is on time for injection.  Pt tolerated well.   Pt advised to RTO on June 13,2016 for next injection.   BP 121/77 mmHg  Pulse 84  Temp(Src) 98 F (36.7 C)  Wt 212 lb (96.163 kg)  Administrations This Visit    medroxyPROGESTERone (DEPO-PROVERA) injection 150 mg    Admin Date Action Dose Route Administered By         11/22/2014 Given 150 mg Intramuscular Lanney GinsSuzanne D Kenzli Barritt, CMA

## 2015-02-09 ENCOUNTER — Other Ambulatory Visit: Payer: Self-pay | Admitting: Obstetrics

## 2015-02-13 ENCOUNTER — Ambulatory Visit: Payer: Medicaid Other

## 2015-02-14 ENCOUNTER — Other Ambulatory Visit: Payer: Self-pay | Admitting: *Deleted

## 2015-02-14 ENCOUNTER — Ambulatory Visit: Payer: Medicaid Other

## 2015-02-14 DIAGNOSIS — Z30018 Encounter for initial prescription of other contraceptives: Secondary | ICD-10-CM

## 2015-02-14 MED ORDER — MEDROXYPROGESTERONE ACETATE 150 MG/ML IM SUSP: 150.0000 mg | INTRAMUSCULAR | Status: DC

## 2015-02-16 ENCOUNTER — Ambulatory Visit (INDEPENDENT_AMBULATORY_CARE_PROVIDER_SITE_OTHER): Payer: Medicaid Other | Admitting: *Deleted

## 2015-02-16 VITALS — BP 121/88 | HR 89 | Temp 98.6°F | Wt 210.0 lb

## 2015-02-16 DIAGNOSIS — Z3042 Encounter for surveillance of injectable contraceptive: Secondary | ICD-10-CM | POA: Diagnosis not present

## 2015-02-16 NOTE — Progress Notes (Signed)
Pt is in office today for depo injection.  Pt is on time for her injection.  Injection given in right deltoid, pt tolerated well.  Pt advised to RTO on 05-10-15 for next injection.  Pt has no other concern today.  BP 121/88 mmHg  Pulse 89  Temp(Src) 98.6 F (37 C)  Wt 210 lb (95.255 kg)  Administrations This Visit    medroxyPROGESTERone (DEPO-PROVERA) injection 150 mg    Admin Date Action Dose Route Administered By         02/16/2015 Given 150 mg Intramuscular Lanney Gins, CMA

## 2015-02-28 ENCOUNTER — Ambulatory Visit: Payer: Medicaid Other | Admitting: Obstetrics

## 2015-04-06 ENCOUNTER — Encounter: Payer: Self-pay | Admitting: Obstetrics

## 2015-04-06 ENCOUNTER — Ambulatory Visit (INDEPENDENT_AMBULATORY_CARE_PROVIDER_SITE_OTHER): Payer: Medicaid Other | Admitting: Certified Nurse Midwife

## 2015-04-06 ENCOUNTER — Encounter: Payer: Self-pay | Admitting: Certified Nurse Midwife

## 2015-04-06 VITALS — BP 122/87 | HR 99 | Temp 98.8°F | Ht 66.0 in | Wt 214.2 lb

## 2015-04-06 DIAGNOSIS — Z113 Encounter for screening for infections with a predominantly sexual mode of transmission: Secondary | ICD-10-CM

## 2015-04-06 DIAGNOSIS — Z0000 Encounter for general adult medical examination without abnormal findings: Secondary | ICD-10-CM | POA: Diagnosis not present

## 2015-04-06 DIAGNOSIS — R1010 Upper abdominal pain, unspecified: Secondary | ICD-10-CM | POA: Diagnosis not present

## 2015-04-06 DIAGNOSIS — Z30018 Encounter for initial prescription of other contraceptives: Secondary | ICD-10-CM

## 2015-04-06 DIAGNOSIS — Z01419 Encounter for gynecological examination (general) (routine) without abnormal findings: Secondary | ICD-10-CM

## 2015-04-06 LAB — CBC WITH DIFFERENTIAL/PLATELET
BASOS PCT: 0 % (ref 0–1)
Basophils Absolute: 0 10*3/uL (ref 0.0–0.1)
Eosinophils Absolute: 0.2 10*3/uL (ref 0.0–0.7)
Eosinophils Relative: 4 % (ref 0–5)
HCT: 35.3 % — ABNORMAL LOW (ref 36.0–46.0)
Hemoglobin: 12.2 g/dL (ref 12.0–15.0)
LYMPHS PCT: 48 % — AB (ref 12–46)
Lymphs Abs: 2 10*3/uL (ref 0.7–4.0)
MCH: 31 pg (ref 26.0–34.0)
MCHC: 34.6 g/dL (ref 30.0–36.0)
MCV: 89.6 fL (ref 78.0–100.0)
MONOS PCT: 11 % (ref 3–12)
MPV: 8.6 fL (ref 8.6–12.4)
Monocytes Absolute: 0.5 10*3/uL (ref 0.1–1.0)
NEUTROS PCT: 37 % — AB (ref 43–77)
Neutro Abs: 1.6 10*3/uL — ABNORMAL LOW (ref 1.7–7.7)
PLATELETS: 269 10*3/uL (ref 150–400)
RBC: 3.94 MIL/uL (ref 3.87–5.11)
RDW: 13 % (ref 11.5–15.5)
WBC: 4.2 10*3/uL (ref 4.0–10.5)

## 2015-04-06 LAB — TSH: TSH: 0.928 u[IU]/mL (ref 0.350–4.500)

## 2015-04-06 LAB — HEPATIC FUNCTION PANEL
ALT: 9 U/L (ref 6–29)
AST: 11 U/L (ref 10–30)
Albumin: 3.8 g/dL (ref 3.6–5.1)
Alkaline Phosphatase: 69 U/L (ref 33–115)
BILIRUBIN TOTAL: 0.4 mg/dL (ref 0.2–1.2)
Bilirubin, Direct: 0.1 mg/dL (ref <=0.2)
Indirect Bilirubin: 0.3 mg/dL (ref 0.2–1.2)
Total Protein: 6.7 g/dL (ref 6.1–8.1)

## 2015-04-06 LAB — COMPREHENSIVE METABOLIC PANEL
ALT: 9 U/L (ref 6–29)
AST: 11 U/L (ref 10–30)
Albumin: 3.8 g/dL (ref 3.6–5.1)
Alkaline Phosphatase: 69 U/L (ref 33–115)
BUN: 13 mg/dL (ref 7–25)
CHLORIDE: 109 mmol/L (ref 98–110)
CO2: 23 mmol/L (ref 20–31)
CREATININE: 0.67 mg/dL (ref 0.50–1.10)
Calcium: 9 mg/dL (ref 8.6–10.2)
Glucose, Bld: 80 mg/dL (ref 65–99)
POTASSIUM: 3.8 mmol/L (ref 3.5–5.3)
Sodium: 141 mmol/L (ref 135–146)
Total Bilirubin: 0.4 mg/dL (ref 0.2–1.2)
Total Protein: 6.7 g/dL (ref 6.1–8.1)

## 2015-04-06 LAB — CHOLESTEROL, TOTAL: Cholesterol: 177 mg/dL (ref 125–200)

## 2015-04-06 LAB — BILIRUBIN,DIRECT & INDIRECT (FRACTIONATED)
Bilirubin, Direct: 0.1 mg/dL (ref <=0.2)
Indirect Bilirubin: 0.3 mg/dL (ref 0.2–1.2)

## 2015-04-06 LAB — HDL CHOLESTEROL: HDL: 66 mg/dL (ref >=46)

## 2015-04-06 LAB — BILIRUBIN, TOTAL: Total Bilirubin: 0.4 mg/dL (ref 0.2–1.2)

## 2015-04-06 LAB — TRIGLYCERIDES: TRIGLYCERIDES: 56 mg/dL (ref <150)

## 2015-04-06 MED ORDER — MEDROXYPROGESTERONE ACETATE 150 MG/ML IM SUSP: 150.0000 mg | INTRAMUSCULAR | Status: DC

## 2015-04-07 ENCOUNTER — Encounter: Payer: Self-pay | Admitting: Internal Medicine

## 2015-04-07 LAB — HIV ANTIBODY (ROUTINE TESTING W REFLEX): HIV 1&2 Ab, 4th Generation: NONREACTIVE

## 2015-04-07 LAB — RPR

## 2015-04-07 LAB — HEPATITIS C ANTIBODY: HCV AB: NEGATIVE

## 2015-04-07 LAB — TESTOSTERONE, FREE, TOTAL, SHBG
SEX HORMONE BINDING: 50 nmol/L (ref 17–124)
TESTOSTERONE FREE: 4.8 pg/mL (ref 0.6–6.8)
TESTOSTERONE-% FREE: 1.4 % (ref 0.4–2.4)
Testosterone: 35 ng/dL (ref 10–70)

## 2015-04-07 LAB — HEPATITIS B SURFACE ANTIGEN: Hepatitis B Surface Ag: NEGATIVE

## 2015-04-07 LAB — PAP IG W/ RFLX HPV ASCU

## 2015-04-07 LAB — PROLACTIN: PROLACTIN: 12.2 ng/mL

## 2015-04-07 LAB — PROGESTERONE: Progesterone: 0.3 ng/mL

## 2015-04-07 NOTE — Progress Notes (Signed)
Patient ID: Rhonda Howard, female   DOB: 08/17/1986, 29 y.o.   MRN: [id number]    Subjective:        Rhonda Howard is a 29 y.o. female here for a routine exam.  Current complaints: C/O cramping, with spotting on Depo injections.  C/O right upper abdominal pain that is cramp like, does have flatulence.  Denies constipation.  Denies any recent infection or diarrhea.   Currently sexually active.   .    Personal health questionnaire:  Is patient [name], have a family history of breast and/or ovarian cancer: no Is there a family history of uterine cancer diagnosed at age < 18, gastrointestinal cancer, urinary tract cancer, family member who is a Personnel officer syndrome-associated carrier: no Is the patient overweight and hypertensive, family history of diabetes, personal history of gestational diabetes, preeclampsia or PCOS: yes Is patient over 30, have PCOS,  family history of premature CHD under age 55, diabetes, smoke, have hypertension or peripheral artery disease:  no At any time, has a partner hit, kicked or otherwise hurt or frightened you?: no Over the past 2 weeks, have you felt down, depressed or hopeless?: no Over the past 2 weeks, have you felt little interest or pleasure in doing things?:no   Gynecologic History No LMP recorded. Patient has had an injection. Contraception: Depo-Provera injections Last Pap: 02/22/14. Results were: normal Last mammogram: N/A.   Obstetric History OB History  Gravida Para Term Preterm AB SAB TAB Ectopic Multiple Living  # Outcome Date GA Lbr Len/2nd Weight Sex Delivery Anes PTL Lv  5 Term 01/30/13 [redacted]w[redacted]d 05:56 / 00:03 7 lb 4.2 oz (3.294 kg) F Vag-Spont None  Y  4 Term 11/13/09 [redacted]w[redacted]d  8 lb 15 oz (4.054 kg) M Vag-Spont None  Y  3 Term 02/20/07 [redacted]w[redacted]d  8 lb 12 oz (3.969 kg) F Vag-Spont None  Y  2 Term 12/05/05 [redacted]w[redacted]d  8 lb 12 oz (3.969 kg) F Vag-Spont None  Y  1 Term 08/09/04 [redacted]w[redacted]d  6 lb 8 oz (2.948 kg) M Vag-Spont EPI  Y       Past Medical History  Diagnosis Date  . Anemia     Past Surgical History  Procedure Laterality Date  . Vaginal delivery      x 4  . No past surgeries       Current outpatient prescriptions:  .  medroxyPROGESTERone (DEPO-PROVERA) 150 MG/ML injection, Inject 1 mL (150 mg total) into the muscle every 3 (three) months., Disp: 1 mL, Rfl: 3 No Known Allergies  History  Substance Use Topics  . Smoking status: Former Games developer  . Smokeless tobacco: Never Used     Comment: quit 2013  . Alcohol Use: No    Family History  Problem Relation Age of Onset  . Diabetes Other   . Hypertension Other   . Lupus Other   . Hypertension Mother   . Stroke Father   . Hypertension Father   . Diabetes Brother       Review of Systems  Constitutional: negative for fatigue and weight loss Respiratory: negative for cough and wheezing Cardiovascular: negative for chest pain, fatigue and palpitations Gastrointestinal: + for abdominal pain and change in bowel habits Musculoskeletal:negative for myalgias Neurological: negative for gait problems and tremors Behavioral/Psych: negative for abusive relationship, depression Endocrine: negative for temperature intolerance   Genitourinary:negative for abnormal menstrual periods, genital lesions, hot flashes, sexual problems  and vaginal discharge Integument/breast: negative for breast lump, breast tenderness, nipple discharge and skin lesion(s)    Objective:       BP 122/87 mmHg  Pulse 99  Temp(Src) 98.8 F (37.1 C)  Ht 5\' 6"  (1.676 m)  Wt 214 lb 3.2 oz (97.16 kg)  BMI 34.59 kg/m2 General:   alert  Skin:   no rash or abnormalities  Lungs:   clear to auscultation bilaterally  Heart:   regular rate and rhythm, S1, S2 normal, no murmur, click, rub or gallop  Breasts:   normal without suspicious masses, skin or nipple changes or axillary nodes  Abdomen:  normal findings: no organomegaly, soft, non-tender and no hernia Obese.  Slight tenderness at  Murphy's point  Pelvis:  External genitalia: normal general appearance Urinary system: urethral meatus normal and bladder without fullness, nontender Vaginal: normal without tenderness, induration or masses Cervix: normal appearance Adnexa: normal bimanual exam Uterus: anteverted and non-tender, normal size   Lab Review Urine pregnancy test Labs reviewed yes Radiologic studies reviewed no  50% of 30 min visit spent on counseling and coordination of care.   Assessment:    Healthy female exam.   ?cholelithiasis Contraception counseling STD examination   Plan:    Education reviewed: calcium supplements, depression evaluation, low fat, low cholesterol diet, safe sex/STD prevention, self breast exams, skin cancer screening and weight bearing exercise. Contraception: Depo-Provera injections. Follow up in: 1 year.   Meds ordered this encounter  Medications  . medroxyPROGESTERone (DEPO-PROVERA) 150 MG/ML injection    Sig: Inject 1 mL (150 mg total) into the muscle every 3 (three) months.    Dispense:  1 mL    Refill:  3   Orders Placed This Encounter  Procedures  . SureSwab, Vaginosis/Vaginitis Plus  . Hepatitis B surface antigen  . RPR  . Hepatitis C antibody  . TSH  . Prolactin  . Testosterone, Free, Total, SHBG  . 17-Hydroxyprogesterone  . Progesterone  . HIV antibody (with reflex)  . CBC with Differential/Platelet  . Comprehensive metabolic panel  . Cholesterol, total  . Triglycerides  . HDL cholesterol  . Bilirubin,Direct/Indirect(Fractionated)  . Hepatic function panel  . Bilirubin, total  . Ambulatory referral to Gastroenterology    Referral Priority:  Routine    Referral Type:  Consultation    Referral Reason:  Specialty Services Required    Number of Visits Requested:  1

## 2015-04-09 LAB — SURESWAB, VAGINOSIS/VAGINITIS PLUS
Atopobium vaginae: NOT DETECTED Log (cells/mL)
C. ALBICANS, DNA: NOT DETECTED
C. GLABRATA, DNA: NOT DETECTED
C. PARAPSILOSIS, DNA: DETECTED — AB
C. TRACHOMATIS RNA, TMA: NOT DETECTED
C. TROPICALIS, DNA: NOT DETECTED
Gardnerella vaginalis: 5.8 Log (cells/mL)
LACTOBACILLUS SPECIES: NOT DETECTED Log (cells/mL)
MEGASPHAERA SPECIES: NOT DETECTED Log (cells/mL)
N. gonorrhoeae RNA, TMA: NOT DETECTED
T. VAGINALIS RNA, QL TMA: NOT DETECTED

## 2015-04-11 ENCOUNTER — Other Ambulatory Visit: Payer: Self-pay | Admitting: Certified Nurse Midwife

## 2015-04-11 DIAGNOSIS — B3731 Acute candidiasis of vulva and vagina: Secondary | ICD-10-CM

## 2015-04-11 DIAGNOSIS — B373 Candidiasis of vulva and vagina: Secondary | ICD-10-CM

## 2015-04-11 LAB — 17-HYDROXYPROGESTERONE: 17-OH-Progesterone, LC/MS/MS: 33 ng/dL

## 2015-04-11 MED ORDER — TERCONAZOLE 0.4 % VA CREA: 1.0000 | TOPICAL_CREAM | Freq: Every day | VAGINAL | Status: DC

## 2015-04-11 MED ORDER — FLUCONAZOLE 100 MG PO TABS: 100.0000 mg | ORAL_TABLET | Freq: Once | ORAL | Status: DC

## 2015-04-12 ENCOUNTER — Telehealth: Payer: Self-pay | Admitting: *Deleted

## 2015-04-12 ENCOUNTER — Telehealth: Payer: Self-pay

## 2015-04-12 ENCOUNTER — Telehealth: Payer: Self-pay | Admitting: Internal Medicine

## 2015-04-12 NOTE — Telephone Encounter (Signed)
Pts appt moved to 04/27/15@8 :15am. Office to notify pt of appt.

## 2015-04-12 NOTE — Telephone Encounter (Signed)
Patient contacted the office stating she is still having the abdominal pain and it is increasing. Patient states she has been referred to GI but her appointment is not until June 05, 2015. Patient is requesting a prescription for pain medication and prefers Percocet stating that is what she has taken in the past that was helpful.  Per Orvilla Cornwall, CNM Left message recommending patient be seen at the emergency room. Also advised patient that we are trying to see if we can get her appointment moved to a sooner date. Patient advised to contact the office she has any further concerns.

## 2015-04-12 NOTE — Telephone Encounter (Signed)
TRIAGE NURSE AT [facility] WILL CALL ME BACK TODAY ABOUT MOVING UP THIS PATIENT'S APPT - WILL LET YOU KNOW WHEN SHE CALLS

## 2015-04-12 NOTE — Telephone Encounter (Signed)
Rhonda Howard was able to move patient's appt to 04/27/15 at 8:15am - soonest they had - was 10/3 - will let patient know

## 2015-04-13 ENCOUNTER — Emergency Department (HOSPITAL_COMMUNITY)
Admission: EM | Admit: 2015-04-13 | Discharge: 2015-04-13 | Disposition: A | Discharge status: Home / Self Care | Payer: Medicaid Other | Attending: Emergency Medicine | Admitting: Emergency Medicine

## 2015-04-13 ENCOUNTER — Encounter (HOSPITAL_COMMUNITY): Payer: Self-pay | Admitting: Emergency Medicine

## 2015-04-13 ENCOUNTER — Telehealth: Payer: Self-pay | Admitting: *Deleted

## 2015-04-13 ENCOUNTER — Emergency Department (HOSPITAL_COMMUNITY): Payer: Medicaid Other

## 2015-04-13 DIAGNOSIS — R1011 Right upper quadrant pain: Secondary | ICD-10-CM | POA: Diagnosis not present

## 2015-04-13 DIAGNOSIS — R102 Pelvic and perineal pain: Secondary | ICD-10-CM | POA: Diagnosis not present

## 2015-04-13 DIAGNOSIS — Z3202 Encounter for pregnancy test, result negative: Secondary | ICD-10-CM | POA: Diagnosis not present

## 2015-04-13 DIAGNOSIS — R110 Nausea: Secondary | ICD-10-CM | POA: Insufficient documentation

## 2015-04-13 DIAGNOSIS — Z862 Personal history of diseases of the blood and blood-forming organs and certain disorders involving the immune mechanism: Secondary | ICD-10-CM | POA: Diagnosis not present

## 2015-04-13 DIAGNOSIS — R1010 Upper abdominal pain, unspecified: Secondary | ICD-10-CM | POA: Diagnosis not present

## 2015-04-13 DIAGNOSIS — Z87891 Personal history of nicotine dependence: Secondary | ICD-10-CM | POA: Diagnosis not present

## 2015-04-13 LAB — LIPASE, BLOOD: LIPASE: 16 U/L — AB (ref 22–51)

## 2015-04-13 LAB — CBC WITH DIFFERENTIAL/PLATELET
Basophils Absolute: 0 10*3/uL (ref 0.0–0.1)
Basophils Relative: 0 % (ref 0–1)
EOS ABS: 0.1 10*3/uL (ref 0.0–0.7)
Eosinophils Relative: 3 % (ref 0–5)
HCT: 37.9 % (ref 36.0–46.0)
HEMOGLOBIN: 12.8 g/dL (ref 12.0–15.0)
Lymphocytes Relative: 49 % — ABNORMAL HIGH (ref 12–46)
Lymphs Abs: 2.2 10*3/uL (ref 0.7–4.0)
MCH: 31.1 pg (ref 26.0–34.0)
MCHC: 33.8 g/dL (ref 30.0–36.0)
MCV: 92 fL (ref 78.0–100.0)
Monocytes Absolute: 0.2 10*3/uL (ref 0.1–1.0)
Monocytes Relative: 5 % (ref 3–12)
Neutro Abs: 1.9 10*3/uL (ref 1.7–7.7)
Neutrophils Relative %: 43 % (ref 43–77)
PLATELETS: 272 10*3/uL (ref 150–400)
RBC: 4.12 MIL/uL (ref 3.87–5.11)
RDW: 12.2 % (ref 11.5–15.5)
WBC: 4.5 10*3/uL (ref 4.0–10.5)

## 2015-04-13 LAB — COMPREHENSIVE METABOLIC PANEL
ALT: 10 U/L — ABNORMAL LOW (ref 14–54)
AST: 14 U/L — AB (ref 15–41)
Albumin: 4 g/dL (ref 3.5–5.0)
Alkaline Phosphatase: 76 U/L (ref 38–126)
Anion gap: 10 (ref 5–15)
BUN: 8 mg/dL (ref 6–20)
CALCIUM: 9.5 mg/dL (ref 8.9–10.3)
CO2: 23 mmol/L (ref 22–32)
Chloride: 106 mmol/L (ref 101–111)
Creatinine, Ser: 0.87 mg/dL (ref 0.44–1.00)
GFR calc Af Amer: >60 mL/min (ref >60)
GFR calc non Af Amer: >60 mL/min (ref >60)
Glucose, Bld: 82 mg/dL (ref 65–99)
Potassium: 3.5 mmol/L (ref 3.5–5.1)
SODIUM: 139 mmol/L (ref 135–145)
TOTAL PROTEIN: 7.2 g/dL (ref 6.5–8.1)
Total Bilirubin: 0.8 mg/dL (ref 0.3–1.2)

## 2015-04-13 LAB — URINALYSIS, ROUTINE W REFLEX MICROSCOPIC
Bilirubin Urine: NEGATIVE
GLUCOSE, UA: NEGATIVE mg/dL
Hgb urine dipstick: NEGATIVE
Ketones, ur: 15 mg/dL — AB
LEUKOCYTES UA: NEGATIVE
NITRITE: NEGATIVE
PH: 6 (ref 5.0–8.0)
PROTEIN: NEGATIVE mg/dL
Specific Gravity, Urine: 1.017 (ref 1.005–1.030)
Urobilinogen, UA: 1 mg/dL (ref 0.0–1.0)

## 2015-04-13 LAB — POC URINE PREG, ED: PREG TEST UR: NEGATIVE

## 2015-04-13 MED ORDER — MORPHINE SULFATE 4 MG/ML IJ SOLN
4.0000 mg | Freq: Once | INTRAMUSCULAR | Status: AC
  Administered 2015-04-13: 4 mg via INTRAVENOUS
  Filled 2015-04-13: qty 1

## 2015-04-13 MED ORDER — ONDANSETRON HCL 4 MG/2ML IJ SOLN
4.0000 mg | Freq: Once | INTRAMUSCULAR | Status: AC
  Administered 2015-04-13: 4 mg via INTRAVENOUS
  Filled 2015-04-13: qty 2

## 2015-04-13 NOTE — ED Notes (Signed)
Pt sts upper to mid abd pain x several weeks; pt sts some nausea

## 2015-04-13 NOTE — ED Notes (Signed)
Patient still out at [facility]. 

## 2015-04-13 NOTE — Discharge Instructions (Signed)
Follow up with gi as discussed Abdominal Pain, Women Abdominal (stomach, pelvic, or belly) pain can be caused by many things. It is important to tell your doctor:  The location of the pain.  Does it come and go or is it present all the time?  Are there things that start the pain (eating certain foods, exercise)?  Are there other symptoms associated with the pain (fever, nausea, vomiting, diarrhea)? All of this is helpful to know when trying to find the cause of the pain. CAUSES   Stomach: virus or bacteria infection, or ulcer.  Intestine: appendicitis (inflamed appendix), regional ileitis (Crohn's disease), ulcerative colitis (inflamed colon), irritable bowel syndrome, diverticulitis (inflamed diverticulum of the colon), or cancer of the stomach or intestine.  Gallbladder disease or stones in the gallbladder.  Kidney disease, kidney stones, or infection.  Pancreas infection or cancer.  Fibromyalgia (pain disorder).  Diseases of the female organs:  Uterus: fibroid (non-cancerous) tumors or infection.  Fallopian tubes: infection or tubal pregnancy.  Ovary: cysts or tumors.  Pelvic adhesions (scar tissue).  Endometriosis (uterus lining tissue growing in the pelvis and on the pelvic organs).  Pelvic congestion syndrome (female organs filling up with blood just before the menstrual period).  Pain with the menstrual period.  Pain with ovulation (producing an egg).  Pain with an IUD (intrauterine device, birth control) in the uterus.  Cancer of the female organs.  Functional pain (pain not caused by a disease, may improve without treatment).  Psychological pain.  Depression. DIAGNOSIS  Your doctor will decide the seriousness of your pain by doing an examination.  Blood tests.  X-rays.  Ultrasound.  CT scan (computed tomography, special type of X-ray).  MRI (magnetic resonance imaging).  Cultures, for infection.  Barium enema (dye inserted in the large  intestine, to better view it with X-rays).  Colonoscopy (looking in intestine with a lighted tube).  Laparoscopy (minor surgery, looking in abdomen with a lighted tube).  Major abdominal exploratory surgery (looking in abdomen with a large incision). TREATMENT  The treatment will depend on the cause of the pain.   Many cases can be observed and treated at home.  Over-the-counter medicines recommended by your caregiver.  Prescription medicine.  Antibiotics, for infection.  Birth control pills, for painful periods or for ovulation pain.  Hormone treatment, for endometriosis.  Nerve blocking injections.  Physical therapy.  Antidepressants.  Counseling with a psychologist or psychiatrist.  Minor or major surgery. HOME CARE INSTRUCTIONS   Do not take laxatives, unless directed by your caregiver.  Take over-the-counter pain medicine only if ordered by your caregiver. Do not take aspirin because it can cause an upset stomach or bleeding.  Try a clear liquid diet (broth or water) as ordered by your caregiver. Slowly move to a bland diet, as tolerated, if the pain is related to the stomach or intestine.  Have a thermometer and take your temperature several times a day, and record it.  Bed rest and sleep, if it helps the pain.  Avoid sexual intercourse, if it causes pain.  Avoid stressful situations.  Keep your follow-up appointments and tests, as your caregiver orders.  If the pain does not go away with medicine or surgery, you may try:  Acupuncture.  Relaxation exercises (yoga, meditation).  Group therapy.  Counseling. SEEK MEDICAL CARE IF:   You notice certain foods cause stomach pain.  Your home care treatment is not helping your pain.  You need stronger pain medicine.  You want your IUD  removed.  You feel faint or lightheaded.  You develop nausea and vomiting.  You develop a rash.  You are having side effects or an allergy to your medicine. SEEK  IMMEDIATE MEDICAL CARE IF:   Your pain does not go away or gets worse.  You have a fever.  Your pain is felt only in portions of the abdomen. The right side could possibly be appendicitis. The left lower portion of the abdomen could be colitis or diverticulitis.  You are passing blood in your stools (bright red or black tarry stools, with or without vomiting).  You have blood in your urine.  You develop chills, with or without a fever.  You pass out. MAKE SURE YOU:   Understand these instructions.  Will watch your condition.  Will get help right away if you are not doing well or get worse. Document Released: 06/16/2007 Document Revised: 01/03/2014 Document Reviewed: 07/06/2009 [facility] Patient Information 2015 [place], [place]. This information is not intended to replace advice given to you by your health care provider. Make sure you discuss any questions you have with your health care provider.

## 2015-04-13 NOTE — Telephone Encounter (Signed)
Patient contacted the office stating her letter that we orginally gave her out her out of work until Tuesday. Patient wanted to know if we could extend her letter for 2-3 more days. Patient states her pain is unbearable. Patient did not go to the emergency room last night as instructed but is there currently. Advised patient would ask her provider and call tomorrow with the answer.

## 2015-04-13 NOTE — ED Provider Notes (Signed)
CSN: [id number]     Arrival date & time 04/13/15  1619 History   This chart was scribed for non-physician practitioner, Teressa Lower, NP working with Lyndal Pulley, MD, by Jarvis Morgan, ED Scribe. This patient was seen in room TR01C/TR01C and the patient's care was started at 6:32 PM.    Chief Complaint  Patient presents with  . Abdominal Pain    The history is provided by the patient. No language interpreter was used.    HPI Comments: Rhonda Howard is a 29 y.o. female who presents to the Emergency Department complaining of intermittent, moderate, pelvic pain onset 1 month. She notes associated epigastric abdominal pain and nausea. She notes she was seen by [facility] and had a pelvic exam which was normal. She was referred to [facility] and has an appt August 25th. She takes Percocet, 10-325 mg, she is prescribed it for her leg pain but states it has not provided any relief for her abdominal pain. Pt does not have menstrual periods due to her BC. She denies any fever, chills, or vomiting.    Past Medical History  Diagnosis Date  . Anemia    Past Surgical History  Procedure Laterality Date  . Vaginal delivery      x 4  . No past surgeries     Family History  Problem Relation Age of Onset  . Diabetes Other   . Hypertension Other   . Lupus Other   . Hypertension Mother   . Stroke Father   . Hypertension Father   . Diabetes Brother    Social History  Substance Use Topics  . Smoking status: Former Games developer  . Smokeless tobacco: Never Used     Comment: quit 2013  . Alcohol Use: No   OB History    Gravida Para Term Preterm AB TAB SAB Ectopic Multiple Living   Review of Systems  Constitutional: Negative for fever and chills.  Gastrointestinal: Positive for nausea and abdominal pain. Negative for vomiting.  Genitourinary: Positive for pelvic pain.  All other systems reviewed and are negative.     Allergies  Review of patient's  allergies indicates no known allergies.  Home Medications   Prior to Admission medications   Medication Sig Start Date End Date Taking? Authorizing Provider  fluconazole (DIFLUCAN) 100 MG tablet Take 1 tablet (100 mg total) by mouth once. Repeat dose in 48-72 hour. 04/11/15  Yes Rachelle A Denney, CNM  oxyCODONE-acetaminophen (PERCOCET) 10-325 MG per tablet Take 1 tablet by mouth 3 (three) times daily as needed. 04/01/15  Yes Historical Provider, MD  terconazole (TERAZOL 7) 0.4 % vaginal cream Place 1 applicator vaginally at bedtime. 04/11/15  Yes Rachelle A Denney, CNM  medroxyPROGESTERone (DEPO-PROVERA) 150 MG/ML injection Inject 1 mL (150 mg total) into the muscle every 3 (three) months. 04/06/15   Roe Coombs, CNM   Triage Vitals: BP 126/78 mmHg  Pulse 102  Temp(Src) 99 F (37.2 C) (Oral)  Resp 16  SpO2 99%  Physical Exam  Constitutional: She is oriented to person, place, and time. She appears well-developed and well-nourished. No distress.  HENT:  Head: Normocephalic and atraumatic.  Eyes: Conjunctivae and EOM are normal.  Neck: Neck supple. No tracheal deviation present.  Cardiovascular: Normal rate.   Pulmonary/Chest: Effort normal. No respiratory distress.  Abdominal: Soft. Bowel sounds are normal. There is tenderness in the right upper quadrant.  Musculoskeletal: Normal  range of motion.  Neurological: She is alert and oriented to person, place, and time.  Skin: Skin is warm and dry.  Psychiatric: She has a normal mood and affect. Her behavior is normal.  Nursing note and vitals reviewed.   ED Course  Procedures (including critical care time)  DIAGNOSTIC STUDIES: Oxygen Saturation is 99% on RA, normal by my interpretation.    COORDINATION OF CARE: 6:44 PM- Will order abdominal US.  Pt advised of plan for treatment and pt agrees.   Labs Review Labs Reviewed  LIPASE, BLOOD - Abnormal; Notable for the following:    Lipase 16 (*)    All other components within  normal limits  COMPREHENSIVE METABOLIC PANEL - Abnormal; Notable for the following:    AST 14 (*)    ALT 10 (*)    All other components within normal limits  URINALYSIS, ROUTINE W REFLEX MICROSCOPIC (NOT AT [facility]) - Abnormal; Notable for the following:    APPearance HAZY (*)    Ketones, ur 15 (*)    All other components within normal limits  CBC WITH DIFFERENTIAL/PLATELET - Abnormal; Notable for the following:    Lymphocytes Relative 49 (*)    All other components within normal limits  POC URINE PREG, ED    Imaging Review US Abdomen Limited Ruq  04/13/2015   CLINICAL DATA:  Right upper quadrant pain  EXAM: US ABDOMEN LIMITED - RIGHT UPPER QUADRANT  COMPARISON:  None.  FINDINGS: Gallbladder:  No gallstones or wall thickening visualized. There is no pericholecystic fluid. No sonographic Murphy sign noted.  Common bile duct:  Diameter: 4 mm. There is no intrahepatic or extrahepatic biliary duct dilatation.  Liver:  No focal lesion identified. Within normal limits in parenchymal echogenicity.  IMPRESSION: Study within normal limits.   Electronically Signed   By: Bretta Bang III M.D.   On: 04/13/2015 20:40      EKG Interpretation None      MDM   Final diagnoses:  Pain of upper abdomen    No acute process noted. Discussed gi follow up with pt. Pt has pain medication at home  I personally performed the services described in this documentation, which was scribed in my presence. The recorded information has been reviewed and is accurate.     Teressa Lower, NP 04/13/15 2106  Lyndal Pulley, MD 04/14/15 Jacinta Shoe

## 2015-04-13 NOTE — ED Notes (Signed)
Patient transported to US 

## 2015-04-14 ENCOUNTER — Encounter (HOSPITAL_COMMUNITY): Payer: Self-pay | Admitting: Emergency Medicine

## 2015-04-14 ENCOUNTER — Emergency Department (HOSPITAL_COMMUNITY)
Admission: EM | Admit: 2015-04-14 | Discharge: 2015-04-14 | Disposition: A | Discharge status: Home / Self Care | Payer: Medicaid Other | Attending: Emergency Medicine | Admitting: Emergency Medicine

## 2015-04-14 DIAGNOSIS — R1011 Right upper quadrant pain: Secondary | ICD-10-CM | POA: Diagnosis not present

## 2015-04-14 DIAGNOSIS — Z862 Personal history of diseases of the blood and blood-forming organs and certain disorders involving the immune mechanism: Secondary | ICD-10-CM | POA: Diagnosis not present

## 2015-04-14 DIAGNOSIS — Z87891 Personal history of nicotine dependence: Secondary | ICD-10-CM | POA: Insufficient documentation

## 2015-04-14 MED ORDER — PANTOPRAZOLE SODIUM 20 MG PO TBEC: 20.0000 mg | DELAYED_RELEASE_TABLET | Freq: Every day | ORAL | Status: DC

## 2015-04-14 NOTE — ED Notes (Signed)
Pt is in stable condition upon d/c and ambulates from ED. 

## 2015-04-14 NOTE — ED Notes (Signed)
Patient states was here yesterday for abdominal pain and is still having pain.   Patient states that she received a note for work for 2 days, but states that is insufficient for her needs.   Patient states "I know my body, I don't think I will be able to go back to work tomorrow".  Patient states she takes percocet for other pain, and has been taking that.  Patient states that she was prescribed zantac but patient states "I can't go to work with something that makes me sleepy".   Patient further states "I really just need another day off work".

## 2015-04-14 NOTE — ED Notes (Signed)
Patient states "i don't need labs.   I just want to be off another day tomorrow.  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  But I want Saturday off too".

## 2015-04-14 NOTE — ED Provider Notes (Signed)
CSN: [id number]     Arrival date & time 04/14/15  1123 History   First MD Initiated Contact with Patient 04/14/15 1128     Chief Complaint  Patient presents with  . Abdominal Pain     (Consider location/radiation/quality/duration/timing/severity/associated sxs/prior Treatment) HPI   Patient is a 29 year old female who presents to the emergency department with persistent right upper quadrant abdominal pain.  She was evaluated yesterday for the same pain, received a full workup, without any acute pathology found and was discharged home and advised to take Zantac.   She has come back today with improvement of pain, and is requesting a work note because she needs more time to rest from her abdominal pain because she has kids and she states, "I have no time for me."  She denies any fever, chills, sweats, weakness, SOB, chest pain, N, V, D, or any other acute issues.  Past Medical History  Diagnosis Date  . Anemia    Past Surgical History  Procedure Laterality Date  . Vaginal delivery      x 4  . No past surgeries     Family History  Problem Relation Age of Onset  . Diabetes Other   . Hypertension Other   . Lupus Other   . Hypertension Mother   . Stroke Father   . Hypertension Father   . Diabetes Brother    Social History  Substance Use Topics  . Smoking status: Former Games developer  . Smokeless tobacco: Never Used     Comment: quit 2013  . Alcohol Use: No   OB History    Gravida Para Term Preterm AB TAB SAB Ectopic Multiple Living   5 5 5       5      Review of Systems 10 Systems reviewed and are negative for acute change except as noted in the HPI.      Allergies  Review of patient's allergies indicates no known allergies.  Home Medications   Prior to Admission medications   Medication Sig Start Date End Date Taking? Authorizing Provider  medroxyPROGESTERone (DEPO-PROVERA) 150 MG/ML injection Inject 1 mL (150 mg total) into the muscle every 3 (three) months. 04/06/15  Yes  Rachelle A Denney, CNM  oxyCODONE-acetaminophen (PERCOCET) 10-325 MG per tablet Take 1 tablet by mouth 3 (three) times daily as needed for pain.  04/01/15  Yes Historical Provider, MD  fluconazole (DIFLUCAN) 100 MG tablet Take 1 tablet (100 mg total) by mouth once. Repeat dose in 48-72 hour. Patient not taking: Reported on 04/14/2015 04/11/15   Roe Coombs, CNM  terconazole (TERAZOL 7) 0.4 % vaginal cream Place 1 applicator vaginally at bedtime. Patient not taking: Reported on 04/14/2015 04/11/15   Rachelle A Denney, CNM   BP 108/77 mmHg  Pulse 89  Temp(Src) 98.2 F (36.8 C) (Oral)  Resp 18  Ht 5\' 6"  (1.676 m)  Wt 218 lb (98.884 kg)  BMI 35.20 kg/m2  SpO2 99% Physical Exam  Constitutional: She is oriented to person, place, and time. She appears well-developed and well-nourished. No distress.  HENT:  Head: Normocephalic and atraumatic.  Nose: Nose normal.  Mouth/Throat: Oropharynx is clear and moist. No oropharyngeal exudate.  Eyes: Conjunctivae and EOM are normal. Pupils are equal, round, and reactive to light. Right eye exhibits no discharge. Left eye exhibits no discharge. No scleral icterus.  Neck: Normal range of motion. No JVD present. No tracheal deviation present. No thyromegaly present.  Cardiovascular: Normal rate, regular rhythm, normal heart sounds and  intact distal pulses.  Exam reveals no gallop and no friction rub.   No murmur heard. Pulmonary/Chest: Effort normal and breath sounds normal. No respiratory distress. She has no wheezes. She has no rales. She exhibits no tenderness.  Abdominal: Soft. Bowel sounds are normal. She exhibits no distension and no mass. There is no tenderness. There is no rebound and no guarding.  Musculoskeletal: Normal range of motion. She exhibits no edema or tenderness.  Lymphadenopathy:    She has no cervical adenopathy.  Neurological: She is alert and oriented to person, place, and time. She has normal reflexes. No cranial nerve deficit. She  exhibits normal muscle tone. Coordination normal.  Skin: Skin is warm and dry. No rash noted. She is not diaphoretic. No erythema. No pallor.  Psychiatric: She has a normal mood and affect. Her behavior is normal. Judgment and thought content normal.  Nursing note and vitals reviewed.   ED Course  Procedures (including critical care time) Labs Review Labs Reviewed - No data to display  Imaging Review US Abdomen Limited Ruq  04/13/2015   CLINICAL DATA:  Right upper quadrant pain  EXAM: US ABDOMEN LIMITED - RIGHT UPPER QUADRANT  COMPARISON:  None.  FINDINGS: Gallbladder:  No gallstones or wall thickening visualized. There is no pericholecystic fluid. No sonographic Murphy sign noted.  Common bile duct:  Diameter: 4 mm. There is no intrahepatic or extrahepatic biliary duct dilatation.  Liver:  No focal lesion identified. Within normal limits in parenchymal echogenicity.  IMPRESSION: Study within normal limits.   Electronically Signed   By: Bretta Bang III M.D.   On: 04/13/2015 20:40   I, Danelle Berry, personally reviewed and evaluated these images and lab results as part of my medical decision-making.   EKG Interpretation None      MDM   Final diagnoses:  None    Pt with no acute issues, worked up for her abdominal pain 2 days ago, returns for a new work note for further time to rest before returning to work.  Benign exam.  Pt has refused labwork.  She is well-appearing, in NAD.  Vitals normal.  Pt was D/C home with note for an additional day off work.    Danelle Berry, PA-C 04/21/15 0134  Jerelyn Scott, MD 04/21/15 [phone]

## 2015-04-27 ENCOUNTER — Ambulatory Visit: Payer: Medicaid Other | Admitting: Physician Assistant

## 2015-05-01 ENCOUNTER — Ambulatory Visit: Payer: Medicaid Other | Admitting: Obstetrics

## 2015-05-03 ENCOUNTER — Ambulatory Visit: Payer: Medicaid Other | Admitting: Obstetrics

## 2015-05-10 ENCOUNTER — Ambulatory Visit: Payer: Medicaid Other | Admitting: *Deleted

## 2015-05-10 ENCOUNTER — Ambulatory Visit: Payer: Medicaid Other

## 2015-05-10 VITALS — BP 124/86 | HR 94 | Temp 98.4°F | Ht 66.0 in | Wt 209.0 lb

## 2015-05-10 DIAGNOSIS — Z3042 Encounter for surveillance of injectable contraceptive: Secondary | ICD-10-CM

## 2015-05-10 MED ORDER — MEDROXYPROGESTERONE ACETATE 150 MG/ML IM SUSP
150.0000 mg | INTRAMUSCULAR | Status: AC
  Administered 2015-05-10 – 2016-04-22 (×4): 150 mg via INTRAMUSCULAR

## 2015-05-10 NOTE — Progress Notes (Signed)
Patient in the office today for a depo injection. Patient is on time for her injection.  Patient tolerated injection well. Patient due for her next injection on August 01, 2015.  BP 124/86 mmHg  Pulse 94  Temp(Src) 98.4 F (36.9 C)  Ht  (1.676 m)  Wt 209 lb (94.802 kg)  BMI 33.75 kg/m2

## 2015-06-05 ENCOUNTER — Ambulatory Visit: Payer: Medicaid Other | Admitting: Internal Medicine

## 2015-06-12 NOTE — Telephone Encounter (Signed)
Patient seen in emergency room 04-14-15.

## 2015-07-31 ENCOUNTER — Telehealth: Payer: Self-pay | Admitting: Obstetrics

## 2015-07-31 ENCOUNTER — Ambulatory Visit: Payer: Medicaid Other

## 2015-08-01 ENCOUNTER — Ambulatory Visit (INDEPENDENT_AMBULATORY_CARE_PROVIDER_SITE_OTHER): Payer: Medicaid Other | Admitting: *Deleted

## 2015-08-01 ENCOUNTER — Ambulatory Visit: Payer: Medicaid Other

## 2015-08-01 VITALS — BP 107/75 | HR 79 | Temp 98.0°F | Wt 208.0 lb

## 2015-08-01 DIAGNOSIS — Z3042 Encounter for surveillance of injectable contraceptive: Secondary | ICD-10-CM

## 2015-08-01 NOTE — Progress Notes (Signed)
Patient in office today for a Depo injection. Patient is on time for her Depo. Patient tolerated injection well Patient due for next injection on Feb. 20, 2017  BP 107/75 mmHg  Pulse 79  Temp(Src) 98 F (36.7 C)  Wt 208 lb (94.348 kg)  Administrations This Visit    medroxyPROGESTERone (DEPO-PROVERA) injection 150 mg    Admin Date Action Dose Route Administered By         08/01/2015 Given 150 mg Intramuscular Henriette CombsAndrea L Elexis Pollak, LPN

## 2015-08-10 NOTE — Telephone Encounter (Signed)
Close encounter 

## 2015-08-29 ENCOUNTER — Encounter (HOSPITAL_COMMUNITY): Payer: Self-pay | Admitting: Emergency Medicine

## 2015-08-29 DIAGNOSIS — R112 Nausea with vomiting, unspecified: Secondary | ICD-10-CM | POA: Diagnosis not present

## 2015-08-29 DIAGNOSIS — Z87891 Personal history of nicotine dependence: Secondary | ICD-10-CM | POA: Diagnosis not present

## 2015-08-29 DIAGNOSIS — Z3202 Encounter for pregnancy test, result negative: Secondary | ICD-10-CM | POA: Insufficient documentation

## 2015-08-29 DIAGNOSIS — R109 Unspecified abdominal pain: Secondary | ICD-10-CM | POA: Insufficient documentation

## 2015-08-29 DIAGNOSIS — R1030 Lower abdominal pain, unspecified: Secondary | ICD-10-CM | POA: Diagnosis present

## 2015-08-29 DIAGNOSIS — Z862 Personal history of diseases of the blood and blood-forming organs and certain disorders involving the immune mechanism: Secondary | ICD-10-CM | POA: Diagnosis not present

## 2015-08-29 LAB — COMPREHENSIVE METABOLIC PANEL
ALT: 11 U/L — AB (ref 14–54)
ANION GAP: 8 (ref 5–15)
AST: 15 U/L (ref 15–41)
Albumin: 3.8 g/dL (ref 3.5–5.0)
Alkaline Phosphatase: 66 U/L (ref 38–126)
BUN: 11 mg/dL (ref 6–20)
CO2: 23 mmol/L (ref 22–32)
CREATININE: 0.83 mg/dL (ref 0.44–1.00)
Calcium: 9.3 mg/dL (ref 8.9–10.3)
Chloride: 109 mmol/L (ref 101–111)
Glucose, Bld: 95 mg/dL (ref 65–99)
POTASSIUM: 4 mmol/L (ref 3.5–5.1)
SODIUM: 140 mmol/L (ref 135–145)
Total Bilirubin: 0.5 mg/dL (ref 0.3–1.2)
Total Protein: 6.8 g/dL (ref 6.5–8.1)

## 2015-08-29 LAB — URINALYSIS, ROUTINE W REFLEX MICROSCOPIC
Bilirubin Urine: NEGATIVE
GLUCOSE, UA: NEGATIVE mg/dL
Ketones, ur: NEGATIVE mg/dL
LEUKOCYTES UA: NEGATIVE
Nitrite: NEGATIVE
Protein, ur: NEGATIVE mg/dL
SPECIFIC GRAVITY, URINE: 1.025 (ref 1.005–1.030)
pH: 6 (ref 5.0–8.0)

## 2015-08-29 LAB — POC URINE PREG, ED: Preg Test, Ur: NEGATIVE

## 2015-08-29 LAB — CBC
HEMATOCRIT: 38.7 % (ref 36.0–46.0)
HEMOGLOBIN: 12.9 g/dL (ref 12.0–15.0)
MCH: 31 pg (ref 26.0–34.0)
MCHC: 33.3 g/dL (ref 30.0–36.0)
MCV: 93 fL (ref 78.0–100.0)
PLATELETS: 275 10*3/uL (ref 150–400)
RBC: 4.16 MIL/uL (ref 3.87–5.11)
RDW: 12 % (ref 11.5–15.5)
WBC: 6.2 10*3/uL (ref 4.0–10.5)

## 2015-08-29 LAB — URINE MICROSCOPIC-ADD ON: WBC UA: NONE SEEN WBC/hpf (ref 0–5)

## 2015-08-29 LAB — LIPASE, BLOOD: LIPASE: 19 U/L (ref 11–51)

## 2015-08-29 NOTE — ED Notes (Signed)
Pt. reports intermittent low abdominal pain with emesis and diarrhea onset yesterday , denies dysuria or fever .

## 2015-08-30 ENCOUNTER — Emergency Department (HOSPITAL_COMMUNITY)
Admission: EM | Admit: 2015-08-30 | Discharge: 2015-08-30 | Disposition: A | Discharge status: Home / Self Care | Payer: Medicaid Other | Attending: Emergency Medicine | Admitting: Emergency Medicine

## 2015-08-30 DIAGNOSIS — R109 Unspecified abdominal pain: Secondary | ICD-10-CM

## 2015-08-30 NOTE — Discharge Instructions (Signed)
You were seen today for your abdominal pain. At this time it is not exactly clear what the causes but this does not seem to be life-threatening. If you develop fever or cannot eat or drink or develop pain in the right lower quadrant return for imaging. Try to eat a bland diet until you're feeling better.  Abdominal Pain, Adult Many things can cause abdominal pain. Usually, abdominal pain is not caused by a disease and will improve without treatment. It can often be observed and treated at home. Your health care provider will do a physical exam and possibly order blood tests and X-rays to help determine the seriousness of your pain. However, in many cases, more time must pass before a clear cause of the pain can be found. Before that point, your health care provider may not know if you need more testing or further treatment. HOME CARE INSTRUCTIONS Monitor your abdominal pain for any changes. The following actions may help to alleviate any discomfort you are experiencing:  Only take over-the-counter or prescription medicines as directed by your health care provider.  Do not take laxatives unless directed to do so by your health care provider.  Try a clear liquid diet (broth, tea, or water) as directed by your health care provider. Slowly move to a bland diet as tolerated. SEEK MEDICAL CARE IF:  You have unexplained abdominal pain.  You have abdominal pain associated with nausea or diarrhea.  You have pain when you urinate or have a bowel movement.  You experience abdominal pain that wakes you in the night.  You have abdominal pain that is worsened or improved by eating food.  You have abdominal pain that is worsened with eating fatty foods.  You have a fever. SEEK IMMEDIATE MEDICAL CARE IF:  Your pain does not go away within 2 hours.  You keep throwing up (vomiting).  Your pain is felt only in portions of the abdomen, such as the right side or the left lower portion of the  abdomen.  You pass bloody or black tarry stools. MAKE SURE YOU:  Understand these instructions.  Will watch your condition.  Will get help right away if you are not doing well or get worse.   This information is not intended to replace advice given to you by your health care provider. Make sure you discuss any questions you have with your health care provider.   Document Released: 05/29/2005 Document Revised: 05/10/2015 Document Reviewed: 04/28/2013 Else[employer] Interactive Patient Education [employer]2016 [employer]

## 2015-08-30 NOTE — ED Provider Notes (Signed)
CSN: [id number]     Arrival date & time 08/29/15  2127 History   By signing my name below, I, Arlan Organshley Leger, attest that this documentation has been prepared under the direction and in the presence of Leta BaptistEmily Roe Izzah Pasqua, MD.  Electronically Signed: Arlan OrganAshley Leger, ED Scribe. 08/30/2015. 2:27 AM.   Chief Complaint  Patient presents with  . Abdominal Pain   The history is provided by the patient. No language interpreter was used.    HPI Comments: Rhonda Howard is a 29 y.o. female without any pertinent past medical history who presents to the Emergency Department complaining of intermittent, ongoing lower abdominal pain x 1 day. Pt states she ate a philly cheese steak sub from a new restaurant yesterday and noted symptoms a few hours after. She also reports 1 episode of vomiting early this morning. Abdominal pain is exacerbated with deep palpation. No alleviating factors at this time. No OTC medications or home remedies attempted prior to arrival. No recent fever, chills, dysuria, or diarrhea. Ms. Jean Howard denies any previous history of same.  PCP: Pcp Not In System    Past Medical History  Diagnosis Date  . Anemia    Past Surgical History  Procedure Laterality Date  . Vaginal delivery      x 4  . No past surgeries     Family History  Problem Relation Age of Onset  . Diabetes Other   . Hypertension Other   . Lupus Other   . Hypertension Mother   . Stroke Father   . Hypertension Father   . Diabetes Brother    Social History  Substance Use Topics  . Smoking status: Former Games developermoker  . Smokeless tobacco: Never Used     Comment: quit 2013  . Alcohol Use: No   OB History    Gravida Para Term Preterm AB TAB SAB Ectopic Multiple Living   5 5 5       5      Review of Systems  Constitutional: Negative for fever, chills and fatigue.  HENT: Negative for congestion, postnasal drip and rhinorrhea.   Eyes: Negative for pain and redness.  Respiratory: Negative for cough, chest tightness and  shortness of breath.   Cardiovascular: Negative for chest pain and palpitations.  Gastrointestinal: Positive for nausea, vomiting and abdominal pain. Negative for diarrhea and constipation.  Genitourinary: Negative for dysuria, urgency, hematuria, vaginal discharge and vaginal pain.  Musculoskeletal: Negative for myalgias and back pain.  Skin: Negative for rash.  Neurological: Negative for dizziness, weakness, light-headedness and headaches.  Hematological: Does not bruise/bleed easily.    A complete 10 system review of systems was obtained and all systems are negative except as noted in the HPI and PMH.    Allergies  Review of patient's allergies indicates no known allergies.  Home Medications   Prior to Admission medications   Medication Sig Start Date End Date Taking? Authorizing Provider  medroxyPROGESTERone (DEPO-PROVERA) 150 MG/ML injection Inject 1 mL (150 mg total) into the muscle every 3 (three) months. 04/06/15  Yes Roe Coombsachelle A Denney, CNM   Triage Vitals: BP 117/84 mmHg  Pulse 73  Temp(Src) 98.3 F (36.8 C) (Oral)  Resp 18  Ht 5\' 6"  (1.676 m)  Wt 205 lb (92.987 kg)  BMI 33.10 kg/m2  SpO2 100%   Physical Exam  Constitutional: She is oriented to person, place, and time. She appears well-developed and well-nourished. No distress.  HENT:  Head: Normocephalic and atraumatic.  Right Ear: External ear normal.  Left Ear: External ear normal.  Nose: Nose normal.  Mouth/Throat: Oropharynx is clear and moist. No oropharyngeal exudate.  Eyes: EOM are normal. Pupils are equal, round, and reactive to light.  Neck: Normal range of motion. Neck supple.  Cardiovascular: Normal rate, regular rhythm, normal heart sounds and intact distal pulses.   No murmur heard. Pulmonary/Chest: Effort normal. No respiratory distress. She has no wheezes. She has no rales.  Abdominal: Soft. She exhibits no distension. There is no tenderness.  Musculoskeletal: Normal range of motion. She exhibits no  edema or tenderness.  Neurological: She is alert and oriented to person, place, and time.  Skin: Skin is warm and dry. No rash noted. She is not diaphoretic.  Vitals reviewed.   ED Course  Procedures (including critical care time)  DIAGNOSTIC STUDIES: Oxygen Saturation is 99% on RA, Normal by my interpretation.    COORDINATION OF CARE: 2:23 AM- Will order Lipase, CMP, CBC, urinalysis, and urine pregnancy. Discussed treatment plan with pt at bedside and pt agreed to plan.     Labs Review Labs Reviewed  COMPREHENSIVE METABOLIC PANEL - Abnormal; Notable for the following:    ALT 11 (*)    All other components within normal limits  URINALYSIS, ROUTINE W REFLEX MICROSCOPIC (NOT AT [facility]) - Abnormal; Notable for the following:    Hgb urine dipstick TRACE (*)    All other components within normal limits  URINE MICROSCOPIC-ADD ON - Abnormal; Notable for the following:    Squamous Epithelial / LPF 0-5 (*)    Bacteria, UA RARE (*)    All other components within normal limits  LIPASE, BLOOD  CBC  POC URINE PREG, ED    Imaging Review No results found. I have personally reviewed and evaluated these images and lab results as part of my medical decision-making.   EKG Interpretation None      MDM  PAtient seen and evaluated in stable condition.  Benign abdominal examination without tenderness.  Normal laboratory studies.  Patient requesting work note.  She was discharged home in stable condition with strict return precautions.  Patient well appearing.   Final diagnoses:  Abdominal pain, unspecified abdominal location    1. Abdominal pain  I personally performed the services described in this documentation, which was scribed in my presence. The recorded information has been reviewed and is accurate.  Leta Baptist, MD 09/01/15 [phone]

## 2015-10-23 ENCOUNTER — Ambulatory Visit: Payer: Medicaid Other

## 2015-10-30 ENCOUNTER — Ambulatory Visit: Payer: Medicaid Other

## 2015-10-30 ENCOUNTER — Ambulatory Visit (INDEPENDENT_AMBULATORY_CARE_PROVIDER_SITE_OTHER): Payer: Medicaid Other | Admitting: *Deleted

## 2015-10-30 VITALS — BP 118/81 | HR 83 | Temp 98.1°F | Wt 201.0 lb

## 2015-10-30 DIAGNOSIS — Z3042 Encounter for surveillance of injectable contraceptive: Secondary | ICD-10-CM

## 2015-10-30 NOTE — Progress Notes (Signed)
Patient in office today for a Depo injection. Patient is on time for her Injection. Patient is due for an annual exam in August 2017  and is aware.  Patient tolerated injection well  Patient due for her next injection on Jan 21, 2016.  \BP 118/81 mmHg  Pulse 83  Temp(Src) 98.1 F (36.7 C)  Wt 201 lb (91.173 kg)  Administrations This Visit    medroxyPROGESTERone (DEPO-PROVERA) injection 150 mg    Admin Date Action Dose Route Administered By         10/30/2015 Given 150 mg Intramuscular Henriette Combs, LPN

## 2016-01-22 ENCOUNTER — Ambulatory Visit: Payer: Medicaid Other

## 2016-01-22 DIAGNOSIS — Z3042 Encounter for surveillance of injectable contraceptive: Secondary | ICD-10-CM

## 2016-01-22 MED ORDER — MEDROXYPROGESTERONE ACETATE 150 MG/ML IM SUSP
150.0000 mg | Freq: Once | INTRAMUSCULAR | Status: AC
  Administered 2016-01-22: 150 mg via INTRAMUSCULAR

## 2016-04-19 ENCOUNTER — Other Ambulatory Visit: Payer: Self-pay | Admitting: Certified Nurse Midwife

## 2016-04-19 ENCOUNTER — Other Ambulatory Visit: Payer: Self-pay | Admitting: *Deleted

## 2016-04-19 ENCOUNTER — Telehealth: Payer: Self-pay | Admitting: Obstetrics

## 2016-04-19 DIAGNOSIS — Z30018 Encounter for initial prescription of other contraceptives: Secondary | ICD-10-CM

## 2016-04-19 MED ORDER — MEDROXYPROGESTERONE ACETATE 150 MG/ML IM SUSP: 150.0000 mg | INTRAMUSCULAR | 0 refills | Status: DC

## 2016-04-19 NOTE — Telephone Encounter (Signed)
Pt needs refill on depo injection. Pharmacy told her the rx expired on 04/04/16. Pt uses the [facility] on [address]. She has an AE scheduled for the following Monday. Please advise.

## 2016-04-22 ENCOUNTER — Ambulatory Visit (INDEPENDENT_AMBULATORY_CARE_PROVIDER_SITE_OTHER): Payer: Medicaid Other | Admitting: *Deleted

## 2016-04-22 DIAGNOSIS — Z3042 Encounter for surveillance of injectable contraceptive: Secondary | ICD-10-CM

## 2016-04-22 NOTE — Progress Notes (Signed)
Date last pap: 04/16/15 Last Depo-Provera: 01/22/16 Side Effects if any: None. Serum HCG indicated? N/A. Depo-Provera 150 mg IM given by: Janit BernMandy Hutchinson, CMA. Next appointment due 11-13wks for next injection.

## 2016-04-29 ENCOUNTER — Encounter: Payer: Self-pay | Admitting: Obstetrics

## 2016-04-29 ENCOUNTER — Ambulatory Visit (INDEPENDENT_AMBULATORY_CARE_PROVIDER_SITE_OTHER): Payer: Medicaid Other | Admitting: Obstetrics

## 2016-04-29 VITALS — BP 119/82 | HR 77 | Temp 97.8°F | Ht 66.0 in | Wt 204.0 lb

## 2016-04-29 DIAGNOSIS — Z3049 Encounter for surveillance of other contraceptives: Secondary | ICD-10-CM | POA: Diagnosis not present

## 2016-04-29 DIAGNOSIS — Z113 Encounter for screening for infections with a predominantly sexual mode of transmission: Secondary | ICD-10-CM

## 2016-04-29 DIAGNOSIS — Z01419 Encounter for gynecological examination (general) (routine) without abnormal findings: Secondary | ICD-10-CM

## 2016-04-29 NOTE — Progress Notes (Signed)
Patient ID: Rhonda Howard, female   DOB: 1986/04/26, 30 y.o.   MRN: [id number]   Subjective:        Rhonda Howard is a 30 y.o. female here for a routine exam.  Current complaints: Change in discharge.  No itching or odor just increased discharge.    Personal health questionnaire:  Is patient [name], have a family history of breast and/or ovarian cancer: no Is there a family history of uterine cancer diagnosed at age < 30, gastrointestinal cancer, urinary tract cancer, family member who is a Personnel officer syndrome-associated carrier: no Is the patient overweight and hypertensive, family history of diabetes, personal history of gestational diabetes, preeclampsia or PCOS: no Is patient over 72, have PCOS,  family history of premature CHD under age 38, diabetes, smoke, have hypertension or peripheral artery disease:  no At any time, has a partner hit, kicked or otherwise hurt or frightened you?: no Over the past 2 weeks, have you felt down, depressed or hopeless?: no Over the past 2 weeks, have you felt little interest or pleasure in doing things?:no   Gynecologic History No LMP recorded. Patient has had an injection. Contraception: Depo-Provera injections Last Pap: 2016. Results were: normal Last mammogram: n/a. Results were: n/a  Obstetric History OB History  Gravida Para Term Preterm AB Living  5 5 5     5   SAB TAB Ectopic Multiple Live Births          5    # Outcome Date GA Lbr Len/2nd Weight Sex Delivery Anes PTL Lv  5 Term 01/30/13 [redacted]w[redacted]d 05:56 / 00:03 7 lb 4.2 oz (3.294 kg) F Vag-Spont None  LIV  4 Term 11/13/09 [redacted]w[redacted]d  8 lb 15 oz (4.054 kg) M Vag-Spont None  LIV  3 Term 02/20/07 [redacted]w[redacted]d  8 lb 12 oz (3.969 kg) F Vag-Spont None  LIV  2 Term 12/05/05 [redacted]w[redacted]d  8 lb 12 oz (3.969 kg) F Vag-Spont None  LIV  1 Term 08/09/04 [redacted]w[redacted]d  6 lb 8 oz (2.948 kg) M Vag-Spont EPI  LIV      Past Medical History:  Diagnosis Date  . Anemia     Past Surgical History:  Procedure Laterality Date   . NO PAST SURGERIES    . VAGINAL DELIVERY     x 4     Current Outpatient Prescriptions:  .  medroxyPROGESTERone (DEPO-PROVERA) 150 MG/ML injection, Inject 1 mL (150 mg total) into the muscle every 3 (three) months., Disp: 1 mL, Rfl: 0 No Known Allergies  Social History  Substance Use Topics  . Smoking status: Former Games developer  . Smokeless tobacco: Never Used     Comment: quit 2013  . Alcohol use 0.0 oz/week     Comment: occasional    Family History  Problem Relation Age of Onset  . Hypertension Mother   . Stroke Father   . Hypertension Father   . Diabetes Other   . Hypertension Other   . Lupus Other   . Diabetes Brother       Review of Systems  Constitutional: negative for fatigue and weight loss Respiratory: negative for cough and wheezing Cardiovascular: negative for chest pain, fatigue and palpitations Gastrointestinal: negative for abdominal pain and change in bowel habits Musculoskeletal:negative for myalgias Neurological: negative for gait problems and tremors Behavioral/Psych: negative for abusive relationship, depression Endocrine: negative for temperature intolerance   Genitourinary:negative for abnormal menstrual periods, genital lesions, hot flashes, sexual problems and vaginal discharge Integument/breast: negative for breast lump,  breast tenderness, nipple discharge and skin lesion(s)    Objective:       BP 119/82   Pulse 77   Temp 97.8 F (36.6 C)   Ht 5\' 6"  (1.676 m)   Wt 204 lb (92.5 kg)   BMI 32.93 kg/m  General:   alert  Skin:   no rash or abnormalities  Lungs:   clear to auscultation bilaterally  Heart:   regular rate and rhythm, S1, S2 normal, no murmur, click, rub or gallop  Breasts:   normal without suspicious masses, skin or nipple changes or axillary nodes  Abdomen:  normal findings: no organomegaly, soft, non-tender and no hernia  Pelvis:  External genitalia: normal general appearance Urinary system: urethral meatus normal and bladder  without fullness, nontender Vaginal: normal without tenderness, induration or masses Cervix: normal appearance Adnexa: normal bimanual exam Uterus: anteverted and non-tender, normal size   Lab Review Urine pregnancy test Labs reviewed yes Radiologic studies reviewed no  50% of 20 min visit spent on counseling and coordination of care.   Assessment:    Healthy female exam.   Vaginal Discharge Contraceptive surveillance.  Pleased with Depo Provera   Plan:      Education reviewed: low fat, low cholesterol diet, safe sex/STD prevention, self breast exams and weight bearing exercise. Contraception: Depo-Provera injections. Follow up in: 1 year.   No orders of the defined types were placed in this encounter.  Orders Placed This Encounter  Procedures  . HIV antibody  . Hepatitis B surface antigen  . Hepatitis C antibody  . RPR     Patient ID: Rhonda DrillingKia A Gaffey, female   DOB: May 24, 1986, 30 y.o.   MRN: [id number]

## 2016-04-30 LAB — HIV ANTIBODY (ROUTINE TESTING W REFLEX): HIV Screen 4th Generation wRfx: NONREACTIVE

## 2016-04-30 LAB — HEPATITIS C ANTIBODY

## 2016-04-30 LAB — HEPATITIS B SURFACE ANTIGEN: Hepatitis B Surface Ag: NEGATIVE

## 2016-04-30 LAB — RPR: RPR: NONREACTIVE

## 2016-05-03 ENCOUNTER — Other Ambulatory Visit: Payer: Self-pay | Admitting: Obstetrics

## 2016-05-03 DIAGNOSIS — A5901 Trichomonal vulvovaginitis: Secondary | ICD-10-CM

## 2016-05-03 LAB — PAP IG AND HPV HIGH-RISK
HPV, HIGH-RISK: POSITIVE — AB
PAP Smear Comment: 0

## 2016-05-03 MED ORDER — METRONIDAZOLE 500 MG PO TABS
2000.0000 mg | ORAL_TABLET | Freq: Once | ORAL | 1 refills | Status: AC
Start: 2016-05-03 — End: 2016-05-03

## 2016-05-07 LAB — NUSWAB VG+, CANDIDA 6SP
Atopobium vaginae: HIGH Score — AB
BVAB 2: HIGH {score} — AB
CANDIDA ALBICANS, NAA: NEGATIVE
CANDIDA GLABRATA, NAA: NEGATIVE
Candida krusei, NAA: NEGATIVE
Candida lusitaniae, NAA: NEGATIVE
Candida parapsilosis, NAA: NEGATIVE
Candida tropicalis, NAA: NEGATIVE
Chlamydia trachomatis, NAA: NEGATIVE
Neisseria gonorrhoeae, NAA: NEGATIVE
Trich vag by NAA: POSITIVE — AB

## 2016-05-08 ENCOUNTER — Telehealth: Payer: Self-pay | Admitting: *Deleted

## 2016-05-08 ENCOUNTER — Other Ambulatory Visit: Payer: Self-pay | Admitting: Obstetrics

## 2016-05-08 DIAGNOSIS — A599 Trichomoniasis, unspecified: Secondary | ICD-10-CM

## 2016-05-08 DIAGNOSIS — N760 Acute vaginitis: Secondary | ICD-10-CM

## 2016-05-08 DIAGNOSIS — B9689 Other specified bacterial agents as the cause of diseases classified elsewhere: Secondary | ICD-10-CM

## 2016-05-08 MED ORDER — TINIDAZOLE 500 MG PO TABS: 2.0000 g | ORAL_TABLET | Freq: Every day | ORAL | 0 refills | Status: DC

## 2016-05-08 NOTE — Telephone Encounter (Signed)
Patient was made aware of lab results and has picked up her medication.Patient made aware she needs to have a TOC in 4-6 weeks.

## 2016-05-10 ENCOUNTER — Encounter: Payer: Self-pay | Admitting: Obstetrics

## 2016-05-22 ENCOUNTER — Telehealth: Payer: Self-pay | Admitting: *Deleted

## 2016-05-22 NOTE — Telephone Encounter (Signed)
Complained of white vaginal discharge no real odor noted.Fluconazole 150 mg 1 tablet every 48 hours x [facility] [address] [phone].

## 2016-06-10 ENCOUNTER — Ambulatory Visit (INDEPENDENT_AMBULATORY_CARE_PROVIDER_SITE_OTHER): Payer: Self-pay | Admitting: Obstetrics

## 2016-06-10 ENCOUNTER — Encounter: Payer: Self-pay | Admitting: Obstetrics

## 2016-06-10 VITALS — BP 109/79 | HR 85 | Temp 98.1°F | Ht 66.0 in | Wt 205.2 lb

## 2016-06-10 DIAGNOSIS — Z0000 Encounter for general adult medical examination without abnormal findings: Secondary | ICD-10-CM

## 2016-06-10 DIAGNOSIS — N898 Other specified noninflammatory disorders of vagina: Secondary | ICD-10-CM

## 2016-06-10 DIAGNOSIS — Z113 Encounter for screening for infections with a predominantly sexual mode of transmission: Secondary | ICD-10-CM

## 2016-06-10 MED ORDER — PRENATAL PLUS/IRON 27-1 MG PO TABS: 1.0000 | ORAL_TABLET | Freq: Every day | ORAL | 11 refills | Status: DC

## 2016-06-10 NOTE — Addendum Note (Signed)
Addended by: Coral CeoHARPER, CHARLES A on: 06/10/2016 11:02 AM   Modules accepted: Orders

## 2016-06-10 NOTE — Progress Notes (Signed)
Patient ID: Rhonda Howard, female   DOB: 03-21-1986, 30 y.o.   MRN: [id number]  Chief Complaint  Patient presents with  . other    follow up on yeast infection, pt. still having discharge    HPI   Rhonda Howard is a 30 y.o. female.  Vaginal discharge with odor.  Recently treated for Trichomonas and Yeast vaginitis.  Denies vaginal itching. HPI  Past Medical History:  Diagnosis Date  . Anemia     Past Surgical History:  Procedure Laterality Date  . NO PAST SURGERIES    . VAGINAL DELIVERY     x 4    Family History  Problem Relation Age of Onset  . Hypertension Mother   . Stroke Father   . Hypertension Father   . Diabetes Other   . Hypertension Other   . Lupus Other   . Diabetes Brother     Social History Social History  Substance Use Topics  . Smoking status: Former Games developermoker  . Smokeless tobacco: Never Used     Comment: quit 2013  . Alcohol use 0.0 oz/week     Comment: occasional    No Known Allergies  Current Outpatient Prescriptions  Medication Sig Dispense Refill  . medroxyPROGESTERone (DEPO-PROVERA) 150 MG/ML injection Inject 1 mL (150 mg total) into the muscle every 3 (three) months. 1 mL 0  . Prenatal Vit-Fe Fumarate-FA (PRENATAL PLUS/IRON) 27-1 MG TABS Take 1 tablet by mouth daily before breakfast. 30 each 11  . tinidazole (TINDAMAX) 500 MG tablet Take 4 tablets (2,000 mg total) by mouth daily with breakfast. (Patient not taking: Reported on 06/10/2016) 8 tablet 0   No current facility-administered medications for this visit.     Review of Systems Review of Systems Constitutional: negative for fatigue and weight loss Respiratory: negative for cough and wheezing Cardiovascular: negative for chest pain, fatigue and palpitations Gastrointestinal: negative for abdominal pain and change in bowel habits Genitourinary:positive for malodorous vaginal discharge Integument/breast: negative for nipple discharge Musculoskeletal:negative for myalgias Neurological:  negative for gait problems and tremors Behavioral/Psych: negative for abusive relationship, depression Endocrine: negative for temperature intolerance     Blood pressure 109/79, pulse 85, temperature 98.1 F (36.7 C), temperature source Oral, height 5\' 6"  (1.676 m), weight 205 lb 3.2 oz (93.1 kg).  Physical Exam Physical Exam            General:  Alert and no distress Abdomen:  normal findings: no organomegaly, soft, non-tender and no hernia  Pelvis:  External genitalia: normal general appearance Urinary system: urethral meatus normal and bladder without fullness, nontender Vaginal: normal without tenderness, induration or masses Cervix: normal appearance Adnexa: normal bimanual exam Uterus: anteverted and non-tender, normal size    50% of 15 min visit spent on counseling and coordination of care.   Data Reviewed Wet prep Cultures  Assessment     Malodorous vaginal discharge.  Probably BV.    Plan    Tindamax Rx F/U prn   No orders of the defined types were placed in this encounter.  Meds ordered this encounter  Medications  . Prenatal Vit-Fe Fumarate-FA (PRENATAL PLUS/IRON) 27-1 MG TABS    Sig: Take 1 tablet by mouth daily before breakfast.    Dispense:  30 each    Refill:  11

## 2016-06-10 NOTE — Addendum Note (Signed)
Addended by: Francene FindersJAMES, Harmani Neto C on: 06/10/2016 11:08 AM   Modules accepted: Orders

## 2016-06-11 LAB — GC/CHLAMYDIA PROBE AMP (~~LOC~~) NOT AT ARMC
CHLAMYDIA, DNA PROBE: NEGATIVE
NEISSERIA GONORRHEA: NEGATIVE

## 2016-07-03 ENCOUNTER — Other Ambulatory Visit: Payer: Self-pay | Admitting: Obstetrics

## 2016-07-03 DIAGNOSIS — Z30018 Encounter for initial prescription of other contraceptives: Secondary | ICD-10-CM

## 2016-07-03 NOTE — Telephone Encounter (Signed)
Refill for depo sent to pharmacy as needed for next injection.

## 2016-07-08 ENCOUNTER — Ambulatory Visit: Payer: Self-pay

## 2016-07-11 ENCOUNTER — Ambulatory Visit (INDEPENDENT_AMBULATORY_CARE_PROVIDER_SITE_OTHER): Payer: Medicaid Other | Admitting: *Deleted

## 2016-07-11 VITALS — BP 109/71 | HR 100 | Wt 209.8 lb

## 2016-07-11 DIAGNOSIS — Z3042 Encounter for surveillance of injectable contraceptive: Secondary | ICD-10-CM

## 2016-07-11 MED ORDER — MEDROXYPROGESTERONE ACETATE 150 MG/ML IM SUSP
150.0000 mg | INTRAMUSCULAR | Status: AC
  Administered 2016-07-11 – 2016-12-26 (×3): 150 mg via INTRAMUSCULAR

## 2016-09-15 ENCOUNTER — Encounter (HOSPITAL_COMMUNITY): Payer: Self-pay | Admitting: Emergency Medicine

## 2016-09-15 ENCOUNTER — Emergency Department (HOSPITAL_COMMUNITY)
Admission: EM | Admit: 2016-09-15 | Discharge: 2016-09-15 | Disposition: A | Discharge status: Home / Self Care | Payer: Medicaid Other | Attending: Emergency Medicine | Admitting: Emergency Medicine

## 2016-09-15 DIAGNOSIS — J111 Influenza due to unidentified influenza virus with other respiratory manifestations: Secondary | ICD-10-CM | POA: Insufficient documentation

## 2016-09-15 DIAGNOSIS — Z87891 Personal history of nicotine dependence: Secondary | ICD-10-CM | POA: Insufficient documentation

## 2016-09-15 DIAGNOSIS — I10 Essential (primary) hypertension: Secondary | ICD-10-CM | POA: Insufficient documentation

## 2016-09-15 LAB — RAPID STREP SCREEN (MED CTR MEBANE ONLY): Streptococcus, Group A Screen (Direct): NEGATIVE

## 2016-09-15 MED ORDER — OSELTAMIVIR PHOSPHATE 75 MG PO CAPS: 75.0000 mg | ORAL_CAPSULE | Freq: Two times a day (BID) | ORAL | 0 refills | Status: DC

## 2016-09-15 MED ORDER — HYDROCODONE-HOMATROPINE 5-1.5 MG/5ML PO SYRP: 5.0000 mL | ORAL_SOLUTION | Freq: Four times a day (QID) | ORAL | 0 refills | Status: DC | PRN

## 2016-09-15 NOTE — ED Triage Notes (Signed)
Pt states "ive had a cold for two days, chills, throat scratching".

## 2016-09-15 NOTE — ED Provider Notes (Signed)
[facility]-EMERGENCY DEPT Provider Note   CSN: [id number] Arrival date & time: 09/15/16  1043  By signing my name below, I, Modena JanskyAlbert Thayil, attest that this documentation has been prepared under the direction and in the presence of Nelva Nayobert Eathen Budreau, MD . Electronically Signed: Modena JanskyAlbert Thayil, Scribe. 09/15/2016. 12:12 PM.  History   Chief Complaint Chief Complaint  Patient presents with  . Sore Throat   The history is provided by the patient. No language interpreter was used.   HPI Comments: Rhonda Howard is a 31 y.o. female who presents to the Emergency Department complaining of intermittent moderate cough that started 3 days ago. She states has been having gradually worsening URI-like symptoms. She describes the cough as non-productive. No treatment PTA or modifying factors. She reports associated symptoms of intermittent subjective fever, congestion, and sore throat. Pt's temperature in the ED today was 98. She admits to sick contacts at work. She denies any diarrhea, vomiting, or other complaints.   Past Medical History:  Diagnosis Date  . Anemia     Patient Active Problem List   Diagnosis Date Noted  . Candidiasis of vulva and vagina 02/22/2014  . Vaginitis and vulvovaginitis, unspecified 01/19/2014  . Normal delivery 02/01/2013  . OBESITY 08/02/2010  . HYPERTENSION, BENIGN ESSENTIAL 08/02/2010  . 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 LEG PAIN, CHRONIC, LEFT 08/02/2010    Past Surgical History:  Procedure Laterality Date  . NO PAST SURGERIES    . VAGINAL DELIVERY     x 4    OB History    Gravida Para Term Preterm AB Living   5 5 5     5    SAB TAB Ectopic Multiple Live Births           5       Home Medications    Prior to Admission medications   Medication Sig Start Date End Date Taking? Authorizing Provider  HYDROcodone-homatropine (HYCODAN) 5-1.5 MG/5ML syrup Take 5 mLs by mouth every 6 (six) hours as needed for cough. 09/15/16   Nelva Nayobert Njeri Vicente, MD    medroxyPROGESTERone (DEPO-PROVERA) 150 MG/ML injection inject intramuscularly every 3 months 07/03/16   Brock Badharles A Harper, MD  oseltamivir (TAMIFLU) 75 MG capsule Take 1 capsule (75 mg total) by mouth every 12 (twelve) hours. 09/15/16   Nelva Nayobert Vaneta Hammontree, MD  Prenatal Vit-Fe Fumarate-FA (PRENATAL PLUS/IRON) 27-1 MG TABS Take 1 tablet by mouth daily before breakfast. 06/10/16   Brock Badharles A Harper, MD    Family History Family History  Problem Relation Age of Onset  . Hypertension Mother   . Stroke Father   . Hypertension Father   . Diabetes Other   . Hypertension Other   . Lupus Other   . Diabetes Brother     Social History Social History  Substance Use Topics  . Smoking status: Former Games developermoker  . Smokeless tobacco: Never Used     Comment: quit 2013  . Alcohol use 0.0 oz/week     Comment: occasional     Allergies   Patient has no known allergies.   Review of Systems Review of Systems  Constitutional: Positive for fever (Subjective).  HENT: Positive for congestion and sore throat.   Respiratory: Positive for cough (Non-productive).   Gastrointestinal: Negative for diarrhea and vomiting.  All other systems reviewed and are negative.    Physical Exam Updated Vital Signs BP 121/73 (BP Location: Left Arm)   Pulse 87   Temp 98 F (36.7 C) (Oral)   Resp 14  SpO2 96%   Physical Exam  Constitutional: She is oriented to person, place, and time. She appears well-developed and well-nourished. No distress.  HENT:  Head: Normocephalic and atraumatic.  Mouth/Throat: Mucous membranes are normal. Posterior oropharyngeal erythema present. No oropharyngeal exudate.  Eyes: Pupils are equal, round, and reactive to light.  Neck: Normal range of motion.  Cardiovascular: Normal rate and intact distal pulses.   Pulmonary/Chest: No respiratory distress.  Abdominal: Normal appearance. She exhibits no distension.  Musculoskeletal: Normal range of motion.  Neurological: She is alert and  oriented to person, place, and time. No cranial nerve deficit.  Skin: Skin is warm and dry. No rash noted.  Psychiatric: She has a normal mood and affect. Her behavior is normal.  Nursing note and vitals reviewed.    ED Treatments / Results  DIAGNOSTIC STUDIES: Oxygen Saturation is 96% on RA, normal by my interpretation.    COORDINATION OF CARE: 12:19 PM- Pt advised of plan for treatment and pt agrees.  Labs (all labs ordered are listed, but only abnormal results are displayed) Labs Reviewed  RAPID STREP SCREEN (NOT AT [facility])  CULTURE, GROUP A STREP [facility])    EKG  EKG Interpretation None       Radiology No results found.  Procedures Procedures (including critical care time)  Medications Ordered in ED Medications - No data to display   Initial Impression / Assessment and Plan / ED Course  I have reviewed the triage vital signs and the nursing notes.  Pertinent labs & imaging results that were available during my care of the patient were reviewed by me and considered in my medical decision making (see chart for details).  Clinical Course       Final Clinical Impressions(s) / ED Diagnoses   Final diagnoses:  Influenza    New Prescriptions New Prescriptions   HYDROCODONE-HOMATROPINE (HYCODAN) 5-1.5 MG/5ML SYRUP    Take 5 mLs by mouth every 6 (six) hours as needed for cough.   OSELTAMIVIR (TAMIFLU) 75 MG CAPSULE    Take 1 capsule (75 mg total) by mouth every 12 (twelve) hours.  I personally performed the services described in this documentation, which was scribed in my presence. The recorded information has been reviewed and considered.     Nelva Nay, MD 09/15/16 [phone]

## 2016-09-15 NOTE — ED Notes (Signed)
Declined W/C at D/C and was escorted to lobby by RN. 

## 2016-09-15 NOTE — ED Notes (Signed)
Pt given ginger ale per Audrey, RN 

## 2016-09-16 ENCOUNTER — Telehealth: Payer: Self-pay | Admitting: *Deleted

## 2016-09-16 NOTE — Telephone Encounter (Signed)
Pharmacy called related to Rx: HYDROcodone-homatropine (HYCODAN) 5-1.5 MG/5ML syrup and the fact that pt has JUST filled Rx for percocet .Marland Kitchen.Marland Kitchen.EDCM clarified with EDP to change Rx to: OTC cough syrup as Medicaid will not pay for cough syrup without prior auth.

## 2016-09-17 LAB — CULTURE, GROUP A STREP (THRC)

## 2016-09-26 ENCOUNTER — Emergency Department (HOSPITAL_COMMUNITY)
Admission: EM | Admit: 2016-09-26 | Discharge: 2016-09-26 | Disposition: A | Discharge status: Home / Self Care | Payer: Medicaid Other | Attending: Emergency Medicine | Admitting: Emergency Medicine

## 2016-09-26 ENCOUNTER — Encounter (HOSPITAL_COMMUNITY): Payer: Self-pay

## 2016-09-26 DIAGNOSIS — I10 Essential (primary) hypertension: Secondary | ICD-10-CM | POA: Insufficient documentation

## 2016-09-26 DIAGNOSIS — Z87891 Personal history of nicotine dependence: Secondary | ICD-10-CM | POA: Insufficient documentation

## 2016-09-26 DIAGNOSIS — Z79899 Other long term (current) drug therapy: Secondary | ICD-10-CM | POA: Insufficient documentation

## 2016-09-26 DIAGNOSIS — B349 Viral infection, unspecified: Secondary | ICD-10-CM

## 2016-09-26 LAB — RAPID STREP SCREEN (MED CTR MEBANE ONLY): Streptococcus, Group A Screen (Direct): NEGATIVE

## 2016-09-26 MED ORDER — CHLORHEXIDINE GLUCONATE 0.12 % MT SOLN: 15.0000 mL | Freq: Two times a day (BID) | OROMUCOSAL | 0 refills | Status: DC

## 2016-09-26 NOTE — ED Provider Notes (Signed)
[facility]-EMERGENCY DEPT Provider Note   CSN: [id number] Arrival date & time: 09/26/16  1254  By signing my name below, I, Majel HomerPeyton Lee, attest that this documentation has been prepared under the direction and in the presence of Fayrene HelperBowie Neilson Oehlert, PA-C . Electronically Signed: Majel HomerPeyton Lee, Scribe. 09/26/2016. 3:10 PM.  History   Chief Complaint Chief Complaint  Patient presents with  . Sore Throat   The history is provided by the patient. No language interpreter was used.   HPI Comments: Rhonda Howard is a 31 y.o. female with PMHx of HTN, who presents to the Emergency Department complaining of gradually worsening, sore throat that began ~3 days ago. Pt reports associated congestion and rhinorrhea. She notes she visited the ED on 09/15/16 for similar symptoms in which she was prescribed Hycodan and Tamiflu with mild relief. She states she has also tried drinking hot tea with honey but notes it "burns her throat even more." Pt reports she currently works at [employer] and returned to the ED today to confirm that she does not have strep throat. She denies vomiting and diarrhea.   History reviewed. No pertinent past medical history.  Patient Active Problem List   Diagnosis Date Noted  . Candidiasis of vulva and vagina 02/22/2014  . Vaginitis and vulvovaginitis, unspecified 01/19/2014  . Normal delivery 02/01/2013  . OBESITY 08/02/2010  . HYPERTENSION, BENIGN ESSENTIAL 08/02/2010  . 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 LEG PAIN, CHRONIC, LEFT 08/02/2010   Past Surgical History:  Procedure Laterality Date  . NO PAST SURGERIES    . VAGINAL DELIVERY     x 4    OB History    Gravida Para Term Preterm AB Living   5 5 5     5    SAB TAB Ectopic Multiple Live Births           5     Home Medications    Prior to Admission medications   Medication Sig Start Date End Date Taking? Authorizing Provider  HYDROcodone-homatropine (HYCODAN) 5-1.5 MG/5ML syrup Take 5 mLs by mouth every 6 (six)  hours as needed for cough. 09/15/16   Nelva Nayobert Beaton, MD  medroxyPROGESTERone (DEPO-PROVERA) 150 MG/ML injection inject intramuscularly every 3 months 07/03/16   Brock Badharles A Harper, MD  oseltamivir (TAMIFLU) 75 MG capsule Take 1 capsule (75 mg total) by mouth every 12 (twelve) hours. 09/15/16   Nelva Nayobert Beaton, MD  Prenatal Vit-Fe Fumarate-FA (PRENATAL PLUS/IRON) 27-1 MG TABS Take 1 tablet by mouth daily before breakfast. 06/10/16   Brock Badharles A Harper, MD    Family History Family History  Problem Relation Age of Onset  . Hypertension Mother   . Stroke Father   . Hypertension Father   . Diabetes Other   . Hypertension Other   . Lupus Other   . Diabetes Brother     Social History Social History  Substance Use Topics  . Smoking status: Former Games developermoker  . Smokeless tobacco: Never Used     Comment: quit 2013  . Alcohol use 0.0 oz/week     Comment: occasional   Allergies   Patient has no known allergies.  Review of Systems Review of Systems  HENT: Positive for congestion, rhinorrhea and sore throat.   Gastrointestinal: Negative for diarrhea and vomiting.   Physical Exam Updated Vital Signs BP 128/91 (BP Location: Left Arm)   Pulse 98   Temp 98.5 F (36.9 C) (Oral)   Resp 18   Ht 5\' 6"  (1.676 m)   Wt  204 lb (92.5 kg)   SpO2 99%   BMI 32.93 kg/m   Physical Exam  Constitutional: She is oriented to person, place, and time. She appears well-developed and well-nourished.  HENT:  Head: Normocephalic.  Right Ear: External ear normal.  Left Ear: External ear normal.  Nose: Nose normal.  Mouth/Throat: Oropharynx is clear and moist.  Eyes: EOM are normal.  Neck: Normal range of motion.  Pulmonary/Chest: Effort normal.  Abdominal: She exhibits no distension.  Musculoskeletal: Normal range of motion.  Neurological: She is alert and oriented to person, place, and time.  Psychiatric: She has a normal mood and affect.  Nursing note and vitals reviewed.  ED Treatments / Results    DIAGNOSTIC STUDIES:  Oxygen Saturation is 99% on RA, normal by my interpretation.    COORDINATION OF CARE:  3:03 PM Discussed treatment plan with pt at bedside and pt agreed to plan.  Labs (all labs ordered are listed, but only abnormal results are displayed) Labs Reviewed  RAPID STREP SCREEN (NOT AT [facility])  CULTURE, GROUP A STREP [facility])   EKG  EKG Interpretation None       Radiology No results found.  Procedures Procedures (including critical care time)  Medications Ordered in ED Medications - No data to display  Initial Impression / Assessment and Plan / ED Course  I have reviewed the triage vital signs and the nursing notes.  Pertinent labs & imaging results that were available during my care of the patient were reviewed by me and considered in my medical decision making (see chart for details).   BP 128/91 (BP Location: Left Arm)   Pulse 98   Temp 98.5 F (36.9 C) (Oral)   Resp 18   Ht 5\' 6"  (1.676 m)   Wt 92.5 kg   SpO2 99%   BMI 32.93 kg/m    I personally performed the services described in this documentation, which was scribed in my presence. The recorded information has been reviewed and is accurate.     Final Clinical Impressions(s) / ED Diagnoses   Final diagnoses:  Viral illness    New Prescriptions New Prescriptions   CHLORHEXIDINE (PERIDEX) 0.12 % SOLUTION    Use as directed 15 mLs in the mouth or throat 2 (two) times daily.   Pt symptoms consistent with URI.  Pt will be discharged with symptomatic treatment.  Discussed return precautions.  Pt is hemodynamically stable & in NAD prior to discharge.    Fayrene Helper, PA-C 09/26/16 1610    Geoffery Lyons, MD 09/26/16 [phone]

## 2016-09-26 NOTE — ED Notes (Signed)
Pt is in stable condition upon d/c and ambulates from ED. 

## 2016-09-26 NOTE — ED Triage Notes (Signed)
Per Pt, Pt reports intermittent fever with sore throat for three days. Pt reports cough and congestion.

## 2016-09-28 LAB — CULTURE, GROUP A STREP (THRC)

## 2016-10-04 ENCOUNTER — Ambulatory Visit (INDEPENDENT_AMBULATORY_CARE_PROVIDER_SITE_OTHER): Payer: Medicaid Other | Admitting: *Deleted

## 2016-10-04 DIAGNOSIS — Z3042 Encounter for surveillance of injectable contraceptive: Secondary | ICD-10-CM | POA: Diagnosis not present

## 2016-10-04 NOTE — Progress Notes (Signed)
Patient is in the office for her depo injection.

## 2016-12-01 ENCOUNTER — Emergency Department (HOSPITAL_COMMUNITY)
Admission: EM | Admit: 2016-12-01 | Discharge: 2016-12-01 | Disposition: A | Discharge status: Home / Self Care | Payer: Medicaid Other | Attending: Emergency Medicine | Admitting: Emergency Medicine

## 2016-12-01 ENCOUNTER — Encounter (HOSPITAL_COMMUNITY): Payer: Self-pay | Admitting: *Deleted

## 2016-12-01 DIAGNOSIS — I10 Essential (primary) hypertension: Secondary | ICD-10-CM | POA: Insufficient documentation

## 2016-12-01 DIAGNOSIS — R110 Nausea: Secondary | ICD-10-CM

## 2016-12-01 DIAGNOSIS — Z87891 Personal history of nicotine dependence: Secondary | ICD-10-CM | POA: Insufficient documentation

## 2016-12-01 LAB — URINALYSIS, ROUTINE W REFLEX MICROSCOPIC
Bilirubin Urine: NEGATIVE
Glucose, UA: NEGATIVE mg/dL
Ketones, ur: NEGATIVE mg/dL
LEUKOCYTES UA: NEGATIVE
NITRITE: NEGATIVE
PH: 5 (ref 5.0–8.0)
Protein, ur: 30 mg/dL — AB
SPECIFIC GRAVITY, URINE: 1.031 — AB (ref 1.005–1.030)

## 2016-12-01 LAB — COMPREHENSIVE METABOLIC PANEL
ALBUMIN: 3.8 g/dL (ref 3.5–5.0)
ALK PHOS: 77 U/L (ref 38–126)
ALT: 12 U/L — AB (ref 14–54)
AST: 14 U/L — AB (ref 15–41)
Anion gap: 7 (ref 5–15)
BILIRUBIN TOTAL: 0.7 mg/dL (ref 0.3–1.2)
BUN: 12 mg/dL (ref 6–20)
CO2: 22 mmol/L (ref 22–32)
CREATININE: 0.73 mg/dL (ref 0.44–1.00)
Calcium: 9.1 mg/dL (ref 8.9–10.3)
Chloride: 110 mmol/L (ref 101–111)
GFR calc Af Amer: >60 mL/min (ref >60)
GLUCOSE: 95 mg/dL (ref 65–99)
Potassium: 3.7 mmol/L (ref 3.5–5.1)
Sodium: 139 mmol/L (ref 135–145)
TOTAL PROTEIN: 6.7 g/dL (ref 6.5–8.1)

## 2016-12-01 LAB — CBC
HEMATOCRIT: 37.9 % (ref 36.0–46.0)
Hemoglobin: 12.5 g/dL (ref 12.0–15.0)
MCH: 30.6 pg (ref 26.0–34.0)
MCHC: 33 g/dL (ref 30.0–36.0)
MCV: 92.7 fL (ref 78.0–100.0)
PLATELETS: 274 10*3/uL (ref 150–400)
RBC: 4.09 MIL/uL (ref 3.87–5.11)
RDW: 12.6 % (ref 11.5–15.5)
WBC: 3.9 10*3/uL — AB (ref 4.0–10.5)

## 2016-12-01 LAB — LIPASE, BLOOD: Lipase: 10 U/L — ABNORMAL LOW (ref 11–51)

## 2016-12-01 LAB — I-STAT BETA HCG BLOOD, ED (MC, WL, AP ONLY): I-stat hCG, quantitative: <5 m[IU]/mL (ref <5)

## 2016-12-01 NOTE — ED Provider Notes (Signed)
[facility] Provider Note   CSN: [id number] Arrival date & time: 12/01/16  1021     History   Chief Complaint Chief Complaint  Patient presents with  . Nausea    HPI Rhonda Howard is a 31 y.o. female.  HPI  31 year old female presents today with complaints of nausea.  Patient reports she has chronic nausea in the morning, has had worsening symptoms over the last several months.  Patient notes she recently started taking Prozac approximately 1 month ago.  She notes that the symptoms are mostly present in the morning, sometimes in the afternoon.  She notes that she eats large meals before bed and feels that this may be making her symptoms worse.  She also reports that she does not eat breakfast and is concerned that this along with Prozac making symptoms worse as well.  Patient notes she is spoken with her behavioral health specialist who is prescribing the medication, and was told that this is a normal side effect of the medication.  Patient denies any fever, vomiting, abdominal pain, or any other concerning complaints today.  Patient is [facility] being [facility] for anxiety.  History reviewed. No pertinent past medical history.  Patient Active Problem List   Diagnosis Date Noted  . Candidiasis of vulva and vagina 02/22/2014  . Vaginitis and vulvovaginitis, unspecified 01/19/2014  . Normal delivery 02/01/2013  . OBESITY 08/02/2010  . HYPERTENSION, BENIGN ESSENTIAL 08/02/2010  . 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 LEG PAIN, CHRONIC, LEFT 08/02/2010    Past Surgical History:  Procedure Laterality Date  . NO PAST SURGERIES    . VAGINAL DELIVERY     x 4    OB History    Gravida Para Term Preterm AB Living   SAB TAB Ectopic Multiple Live Births           5       Home Medications    Prior to Admission medications   Medication Sig Start Date End Date Taking? Authorizing Provider  medroxyPROGESTERone (DEPO-PROVERA) 150 MG/ML  injection inject intramuscularly every 3 months 07/03/16   Brock Bad, MD    Family History Family History  Problem Relation Age of Onset  . Hypertension Mother   . Stroke Father   . Hypertension Father   . Diabetes Other   . Hypertension Other   . Lupus Other   . Diabetes Brother     Social History Social History  Substance Use Topics  . Smoking status: Former Games developer  . Smokeless tobacco: Never Used     Comment: quit 2013  . Alcohol use 0.0 oz/week     Comment: occasional     Allergies   Patient has no known allergies.   Review of Systems Review of Systems  Constitutional: Negative for appetite change and fever.  Gastrointestinal: Negative for abdominal pain and vomiting.  All other systems reviewed and are negative.    Physical Exam Updated Vital Signs BP 118/82   Pulse 75   Temp 98 F (36.7 C) (Oral)   Resp 16   SpO2 100%   Physical Exam  Constitutional: She is oriented to person, place, and time. She appears well-developed and well-nourished.  HENT:  Head: Normocephalic and atraumatic.  Eyes: Conjunctivae are normal. Pupils are equal, round, and reactive to light. Right eye exhibits no discharge. Left eye exhibits no discharge. No scleral icterus.  Neck: Normal range of motion. No JVD  present. No tracheal deviation present.  Pulmonary/Chest: Effort normal. No stridor.  Abdominal: Soft. She exhibits no distension and no mass. There is no tenderness. There is no rebound and no guarding. No hernia.  Neurological: She is alert and oriented to person, place, and time. Coordination normal.  Psychiatric: She has a normal mood and affect. Her behavior is normal. Judgment and thought content normal.  Nursing note and vitals reviewed.    ED Treatments / Results  Labs (all labs ordered are listed, but only abnormal results are displayed) Labs Reviewed  LIPASE, BLOOD - Abnormal; Notable for the following:       Result Value   Lipase 10 (*)    All other  components within normal limits  COMPREHENSIVE METABOLIC PANEL - Abnormal; Notable for the following:    AST 14 (*)    ALT 12 (*)    All other components within normal limits  CBC - Abnormal; Notable for the following:    WBC 3.9 (*)    All other components within normal limits  URINALYSIS, ROUTINE W REFLEX MICROSCOPIC  I-STAT BETA HCG BLOOD, ED ([facility], [facility], [facility] ONLY)    EKG  EKG Interpretation None       Radiology No results found.  Procedures Procedures (including critical care time)  Medications Ordered in ED Medications - No data to display   Initial Impression / Assessment and Plan / ED Course  I have reviewed the triage vital signs and the nursing notes.  Pertinent labs & imaging results that were available during my care of the patient were reviewed by me and considered in my medical decision making (see chart for details).      Final Clinical Impressions(s) / ED Diagnoses   Final diagnoses:  Nausea    Labs: I-STAT beta-hCG, lipase, CMP, CBC  Imaging:  Consults:  Therapeutics:  Discharge Meds:   Assessment/Plan: 31 year old female presents today with complaints of nausea.  This is somewhat of a chronic issue for this patient, and slightly worsened with the addition of Prozac.  Patient is having no vomiting, no pain, no other concerning signs or symptoms at this time.  Patient is instructed to eat very small meals in the morning, avoid any large meals late at night before bed.  Patient will follow up with her behavioral health specialist prescribing her medications and discuss treatment options in the event symptoms persist and do not improve.  Patient is given strict return precautions, she verbalized understanding and agreement to today's plan had no further questions or concerns      New Prescriptions New Prescriptions   No medications on file     Eyvonne Mechanic, PA-C 12/01/16 1212    Canary Brim Tegeler, MD 12/02/16 [phone]

## 2016-12-01 NOTE — Discharge Instructions (Signed)
Please read attached information. If you experience any new or worsening signs or symptoms please return to the emergency room for evaluation. Please follow-up with your primary care provider or specialist as discussed.  °

## 2016-12-01 NOTE — ED Triage Notes (Signed)
Pt reports nausea for 2 weeks. Pt states that she is unsure if it is from something that she ate or from her new medications.

## 2016-12-26 ENCOUNTER — Ambulatory Visit (INDEPENDENT_AMBULATORY_CARE_PROVIDER_SITE_OTHER): Payer: Medicaid Other

## 2016-12-26 ENCOUNTER — Encounter: Payer: Self-pay | Admitting: Obstetrics

## 2016-12-26 VITALS — Wt 209.6 lb

## 2016-12-26 DIAGNOSIS — Z3042 Encounter for surveillance of injectable contraceptive: Secondary | ICD-10-CM

## 2016-12-26 NOTE — Progress Notes (Signed)
Patient is in the office for depo injection, administered and pt tolerated well .Marland Kitchen Administrations This Visit    medroxyPROGESTERone (DEPO-PROVERA) injection 150 mg    Admin Date 12/26/2016 Action Given Dose 150 mg Route Intramuscular Administered By Katrina Stack, RN

## 2017-01-31 DEATH — deceased

## 2017-03-19 ENCOUNTER — Ambulatory Visit: Payer: Medicaid Other

## 2017-11-13 ENCOUNTER — Encounter: Payer: Self-pay | Admitting: *Deleted
